# Patient Record
Sex: Male | Born: 1945 | Race: White | Hispanic: No | Marital: Married | Smoking: Former smoker
Health system: Southern US, Community
[De-identification: ages and names within clinical notes are randomized; demographics above are authoritative.]

## PROBLEM LIST (undated history)

## (undated) DIAGNOSIS — M5030 Other cervical disc degeneration, unspecified cervical region: Secondary | ICD-10-CM

## (undated) DIAGNOSIS — G20A1 Parkinson's disease without dyskinesia, without mention of fluctuations: Secondary | ICD-10-CM

## (undated) DIAGNOSIS — E785 Hyperlipidemia, unspecified: Secondary | ICD-10-CM

## (undated) DIAGNOSIS — H9190 Unspecified hearing loss, unspecified ear: Secondary | ICD-10-CM

## (undated) DIAGNOSIS — G20 Parkinson's disease: Secondary | ICD-10-CM

## (undated) HISTORY — PX: HERNIA REPAIR: SHX51

## (undated) HISTORY — DX: Parkinson's disease: G20

## (undated) HISTORY — DX: Parkinson's disease without dyskinesia, without mention of fluctuations: G20.A1

## (undated) HISTORY — DX: Other cervical disc degeneration, unspecified cervical region: M50.30

## (undated) HISTORY — DX: Hyperlipidemia, unspecified: E78.5

## (undated) HISTORY — DX: Unspecified hearing loss, unspecified ear: H91.90

---

## 2006-06-29 ENCOUNTER — Ambulatory Visit: Payer: Self-pay | Admitting: Family Medicine

## 2006-11-07 ENCOUNTER — Ambulatory Visit: Payer: Self-pay | Admitting: Family Medicine

## 2006-11-15 ENCOUNTER — Ambulatory Visit: Payer: Self-pay | Admitting: Family Medicine

## 2006-11-15 LAB — CONVERTED CEMR LAB
ALT: 17 units/L (ref 0–40)
AST: 17 units/L (ref 0–37)
Alkaline Phosphatase: 82 units/L (ref 39–117)
Cholesterol: 198 mg/dL (ref 0–200)
HDL: 49.5 mg/dL (ref >39.0)
LDL Cholesterol: 129 mg/dL — ABNORMAL HIGH (ref 0–99)
Total CHOL/HDL Ratio: 4
Triglycerides: 96 mg/dL (ref 0–149)
VLDL: 19 mg/dL (ref 0–40)

## 2006-11-29 ENCOUNTER — Ambulatory Visit: Payer: Self-pay | Admitting: Family Medicine

## 2006-11-29 LAB — CONVERTED CEMR LAB
Basophils Absolute: 0 10*3/uL (ref 0.0–0.1)
Basophils Relative: 0.4 % (ref 0.0–1.0)
Eosinophils Absolute: 0.1 10*3/uL (ref 0.0–0.6)
Eosinophils Relative: 1.1 % (ref 0.0–5.0)
HCT: 46 % (ref 39.0–52.0)
Hemoglobin: 15.8 g/dL (ref 13.0–17.0)
Lymphocytes Relative: 22.3 % (ref 12.0–46.0)
MCHC: 34.3 g/dL (ref 30.0–36.0)
MCV: 91.1 fL (ref 78.0–100.0)
Monocytes Absolute: 0.4 10*3/uL (ref 0.2–0.7)
Monocytes Relative: 8.3 % (ref 3.0–11.0)
Neutro Abs: 3.1 10*3/uL (ref 1.4–7.7)
Neutrophils Relative %: 67.9 % (ref 43.0–77.0)
PSA: 0.54 ng/mL (ref 0.10–4.00)
Platelets: 168 10*3/uL (ref 150–400)
RBC: 5.05 M/uL (ref 4.22–5.81)
RDW: 12.3 % (ref 11.5–14.6)
TSH: 2.09 microintl units/mL (ref 0.35–5.50)
WBC: 4.6 10*3/uL (ref 4.5–10.5)

## 2007-06-12 DIAGNOSIS — G20 Parkinson's disease: Secondary | ICD-10-CM | POA: Insufficient documentation

## 2007-06-12 DIAGNOSIS — E785 Hyperlipidemia, unspecified: Secondary | ICD-10-CM | POA: Insufficient documentation

## 2007-11-06 ENCOUNTER — Ambulatory Visit: Payer: Self-pay | Admitting: Family Medicine

## 2007-11-06 DIAGNOSIS — M542 Cervicalgia: Secondary | ICD-10-CM | POA: Insufficient documentation

## 2007-11-06 DIAGNOSIS — K4090 Unilateral inguinal hernia, without obstruction or gangrene, not specified as recurrent: Secondary | ICD-10-CM | POA: Insufficient documentation

## 2007-12-01 ENCOUNTER — Ambulatory Visit: Payer: Self-pay | Admitting: Family Medicine

## 2007-12-01 LAB — CONVERTED CEMR LAB
ALT: 18 units/L (ref 0–53)
AST: 16 units/L (ref 0–37)
Albumin: 3.7 g/dL (ref 3.5–5.2)
Alkaline Phosphatase: 65 units/L (ref 39–117)
BUN: 14 mg/dL (ref 6–23)
Basophils Absolute: 0 10*3/uL (ref 0.0–0.1)
Basophils Relative: 0.6 % (ref 0.0–1.0)
Bilirubin Urine: NEGATIVE
Bilirubin, Direct: 0.3 mg/dL (ref 0.0–0.3)
Blood in Urine, dipstick: NEGATIVE
CO2: 30 meq/L (ref 19–32)
Calcium: 8.9 mg/dL (ref 8.4–10.5)
Chloride: 105 meq/L (ref 96–112)
Cholesterol: 205 mg/dL (ref 0–200)
Creatinine, Ser: 1.1 mg/dL (ref 0.4–1.5)
Direct LDL: 138.6 mg/dL
Eosinophils Absolute: 0.1 10*3/uL (ref 0.0–0.6)
Eosinophils Relative: 1.8 % (ref 0.0–5.0)
GFR calc Af Amer: 88 mL/min
GFR calc non Af Amer: 73 mL/min
Glucose, Bld: 93 mg/dL (ref 70–99)
HCT: 46.7 % (ref 39.0–52.0)
HDL: 55.9 mg/dL (ref >39.0)
Hemoglobin: 15.7 g/dL (ref 13.0–17.0)
Ketones, urine, test strip: NEGATIVE
Lymphocytes Relative: 22.8 % (ref 12.0–46.0)
MCHC: 33.5 g/dL (ref 30.0–36.0)
MCV: 90.9 fL (ref 78.0–100.0)
Monocytes Absolute: 0.4 10*3/uL (ref 0.2–0.7)
Monocytes Relative: 8.6 % (ref 3.0–11.0)
Neutro Abs: 2.9 10*3/uL (ref 1.4–7.7)
Neutrophils Relative %: 66.2 % (ref 43.0–77.0)
Nitrite: NEGATIVE
PSA: 0.42 ng/mL (ref 0.10–4.00)
Platelets: 144 10*3/uL — ABNORMAL LOW (ref 150–400)
Potassium: 4.2 meq/L (ref 3.5–5.1)
Protein, U semiquant: NEGATIVE
RBC: 5.14 M/uL (ref 4.22–5.81)
RDW: 12.3 % (ref 11.5–14.6)
Sodium: 140 meq/L (ref 135–145)
Specific Gravity, Urine: 1.025
TSH: 1.88 microintl units/mL (ref 0.35–5.50)
Total Bilirubin: 0.9 mg/dL (ref 0.3–1.2)
Total CHOL/HDL Ratio: 3.7
Total Protein: 5.6 g/dL — ABNORMAL LOW (ref 6.0–8.3)
Triglycerides: 54 mg/dL (ref 0–149)
Urobilinogen, UA: 0.2
VLDL: 11 mg/dL (ref 0–40)
WBC Urine, dipstick: NEGATIVE
WBC: 4.4 10*3/uL — ABNORMAL LOW (ref 4.5–10.5)
pH: 5.5

## 2007-12-11 ENCOUNTER — Ambulatory Visit: Payer: Self-pay | Admitting: Family Medicine

## 2007-12-12 ENCOUNTER — Encounter: Payer: Self-pay | Admitting: Internal Medicine

## 2008-03-04 ENCOUNTER — Ambulatory Visit: Payer: Self-pay | Admitting: Internal Medicine

## 2008-03-19 ENCOUNTER — Ambulatory Visit: Payer: Self-pay | Admitting: Internal Medicine

## 2008-03-19 LAB — HM COLONOSCOPY

## 2009-01-01 ENCOUNTER — Ambulatory Visit: Payer: Self-pay | Admitting: Family Medicine

## 2009-01-01 LAB — CONVERTED CEMR LAB
ALT: 16 units/L (ref 0–53)
AST: 15 units/L (ref 0–37)
Albumin: 4 g/dL (ref 3.5–5.2)
Alkaline Phosphatase: 65 units/L (ref 39–117)
BUN: 19 mg/dL (ref 6–23)
Basophils Absolute: 0 10*3/uL (ref 0.0–0.1)
Basophils Relative: 0.4 % (ref 0.0–3.0)
Bilirubin Urine: NEGATIVE
Bilirubin, Direct: 0.2 mg/dL (ref 0.0–0.3)
Blood in Urine, dipstick: NEGATIVE
CO2: 31 meq/L (ref 19–32)
Calcium: 8.8 mg/dL (ref 8.4–10.5)
Chloride: 108 meq/L (ref 96–112)
Cholesterol: 224 mg/dL — ABNORMAL HIGH (ref 0–200)
Creatinine, Ser: 1 mg/dL (ref 0.4–1.5)
Direct LDL: 145 mg/dL
Eosinophils Absolute: 0.1 10*3/uL (ref 0.0–0.7)
Eosinophils Relative: 1.3 % (ref 0.0–5.0)
GFR calc non Af Amer: 80.63 mL/min (ref >60)
Glucose, Bld: 100 mg/dL — ABNORMAL HIGH (ref 70–99)
Glucose, Urine, Semiquant: NEGATIVE
HCT: 46.4 % (ref 39.0–52.0)
HDL: 62.3 mg/dL (ref >39.00)
Hemoglobin: 15.8 g/dL (ref 13.0–17.0)
Ketones, urine, test strip: NEGATIVE
Lymphocytes Relative: 22.4 % (ref 12.0–46.0)
Lymphs Abs: 1.1 10*3/uL (ref 0.7–4.0)
MCHC: 34 g/dL (ref 30.0–36.0)
MCV: 92.2 fL (ref 78.0–100.0)
Monocytes Absolute: 0.3 10*3/uL (ref 0.1–1.0)
Monocytes Relative: 7 % (ref 3.0–12.0)
Neutro Abs: 3.3 10*3/uL (ref 1.4–7.7)
Neutrophils Relative %: 68.9 % (ref 43.0–77.0)
Nitrite: NEGATIVE
PSA: 0.39 ng/mL (ref 0.10–4.00)
Platelets: 139 10*3/uL — ABNORMAL LOW (ref 150.0–400.0)
Potassium: 3.8 meq/L (ref 3.5–5.1)
RBC: 5.03 M/uL (ref 4.22–5.81)
RDW: 12.4 % (ref 11.5–14.6)
Sodium: 144 meq/L (ref 135–145)
Specific Gravity, Urine: 1.025
TSH: 1.99 microintl units/mL (ref 0.35–5.50)
Total Bilirubin: 0.9 mg/dL (ref 0.3–1.2)
Total CHOL/HDL Ratio: 4
Total Protein: 6.2 g/dL (ref 6.0–8.3)
Triglycerides: 66 mg/dL (ref 0.0–149.0)
Urobilinogen, UA: 0.2
VLDL: 13.2 mg/dL (ref 0.0–40.0)
WBC Urine, dipstick: NEGATIVE
WBC: 4.8 10*3/uL (ref 4.5–10.5)
pH: 5

## 2009-01-10 LAB — CONVERTED CEMR LAB: Vit D, 25-Hydroxy: 32 ng/mL (ref 30–89)

## 2009-01-21 ENCOUNTER — Ambulatory Visit: Payer: Self-pay | Admitting: Family Medicine

## 2009-01-21 DIAGNOSIS — F528 Other sexual dysfunction not due to a substance or known physiological condition: Secondary | ICD-10-CM | POA: Insufficient documentation

## 2009-10-04 HISTORY — PX: TOTAL KNEE ARTHROPLASTY: SHX125

## 2010-03-13 ENCOUNTER — Ambulatory Visit: Payer: Self-pay | Admitting: Family Medicine

## 2010-03-13 LAB — CONVERTED CEMR LAB
ALT: 18 units/L (ref 0–53)
AST: 17 units/L (ref 0–37)
Albumin: 4.2 g/dL (ref 3.5–5.2)
Alkaline Phosphatase: 74 units/L (ref 39–117)
BUN: 18 mg/dL (ref 6–23)
Basophils Absolute: 0 10*3/uL (ref 0.0–0.1)
Basophils Relative: 0.5 % (ref 0.0–3.0)
Bilirubin Urine: NEGATIVE
Bilirubin, Direct: 0.1 mg/dL (ref 0.0–0.3)
Blood in Urine, dipstick: NEGATIVE
CO2: 30 meq/L (ref 19–32)
Calcium: 8.9 mg/dL (ref 8.4–10.5)
Chloride: 106 meq/L (ref 96–112)
Cholesterol: 229 mg/dL — ABNORMAL HIGH (ref 0–200)
Creatinine, Ser: 1.1 mg/dL (ref 0.4–1.5)
Direct LDL: 149 mg/dL
Eosinophils Absolute: 0 10*3/uL (ref 0.0–0.7)
Eosinophils Relative: 0.9 % (ref 0.0–5.0)
GFR calc non Af Amer: 75.1 mL/min (ref >60)
Glucose, Bld: 95 mg/dL (ref 70–99)
Glucose, Urine, Semiquant: NEGATIVE
HCT: 45.6 % (ref 39.0–52.0)
HDL: 67.4 mg/dL (ref >39.00)
Hemoglobin: 15.7 g/dL (ref 13.0–17.0)
Ketones, urine, test strip: NEGATIVE
Lymphocytes Relative: 23.4 % (ref 12.0–46.0)
Lymphs Abs: 1.3 10*3/uL (ref 0.7–4.0)
MCHC: 34.4 g/dL (ref 30.0–36.0)
MCV: 93 fL (ref 78.0–100.0)
Monocytes Absolute: 0.4 10*3/uL (ref 0.1–1.0)
Monocytes Relative: 6.9 % (ref 3.0–12.0)
Neutro Abs: 3.7 10*3/uL (ref 1.4–7.7)
Neutrophils Relative %: 68.3 % (ref 43.0–77.0)
Nitrite: NEGATIVE
PSA: 0.42 ng/mL (ref 0.10–4.00)
Platelets: 170 10*3/uL (ref 150.0–400.0)
Potassium: 4.6 meq/L (ref 3.5–5.1)
Protein, U semiquant: NEGATIVE
RBC: 4.9 M/uL (ref 4.22–5.81)
RDW: 13.4 % (ref 11.5–14.6)
Sodium: 141 meq/L (ref 135–145)
Specific Gravity, Urine: >=1.03
TSH: 1.93 microintl units/mL (ref 0.35–5.50)
Total Bilirubin: 0.9 mg/dL (ref 0.3–1.2)
Total CHOL/HDL Ratio: 3
Total Protein: 6.3 g/dL (ref 6.0–8.3)
Triglycerides: 42 mg/dL (ref 0.0–149.0)
Urobilinogen, UA: 0.2
VLDL: 8.4 mg/dL (ref 0.0–40.0)
WBC Urine, dipstick: NEGATIVE
WBC: 5.4 10*3/uL (ref 4.5–10.5)
pH: 5

## 2010-04-21 ENCOUNTER — Ambulatory Visit: Payer: Self-pay | Admitting: Family Medicine

## 2010-05-05 ENCOUNTER — Telehealth: Payer: Self-pay | Admitting: Family Medicine

## 2010-07-17 ENCOUNTER — Ambulatory Visit: Payer: Self-pay | Admitting: Family Medicine

## 2010-07-17 DIAGNOSIS — M1710 Unilateral primary osteoarthritis, unspecified knee: Secondary | ICD-10-CM

## 2010-07-17 DIAGNOSIS — IMO0002 Reserved for concepts with insufficient information to code with codable children: Secondary | ICD-10-CM | POA: Insufficient documentation

## 2010-08-03 ENCOUNTER — Inpatient Hospital Stay (HOSPITAL_COMMUNITY): Admission: RE | Admit: 2010-08-03 | Discharge: 2010-08-06 | Payer: Self-pay | Admitting: Orthopedic Surgery

## 2010-08-20 ENCOUNTER — Encounter: Admission: RE | Admit: 2010-08-20 | Discharge: 2010-08-20 | Payer: Self-pay | Admitting: Neurology

## 2010-09-14 ENCOUNTER — Encounter
Admission: RE | Admit: 2010-09-14 | Discharge: 2010-10-01 | Payer: Self-pay | Source: Home / Self Care | Attending: Neurology | Admitting: Neurology

## 2010-10-01 ENCOUNTER — Encounter
Admission: RE | Admit: 2010-10-01 | Discharge: 2010-11-03 | Payer: Self-pay | Source: Home / Self Care | Attending: Neurology | Admitting: Neurology

## 2010-10-04 HISTORY — PX: CERVICAL LAMINECTOMY: SHX94

## 2010-10-28 ENCOUNTER — Encounter: Admit: 2010-10-28 | Payer: Self-pay | Admitting: Neurology

## 2010-11-03 ENCOUNTER — Encounter: Admit: 2010-11-03 | Discharge: 2010-11-03 | Payer: Self-pay | Attending: Neurology | Admitting: Neurology

## 2010-11-03 NOTE — Progress Notes (Signed)
Summary: Pt req a script for Celebrex 200mg    Phone Note Call from Patient Call back at Home Phone [phone]   Caller: Patient Summary of Call: Pt req script for Celebrex200mg  1 capsule each morning.  Pt says that his arthirtus doctor, Dr. Madelon Lips, wouldnt fill med because pt hasn't been to see him for a while. Dr. Madelon Lips recommended that he contact his PCP for refill. Pls call in to [facility] [phone] Initial call taken by: Lucy Antigua,  May 05, 2010 10:01 AM  Follow-up for Phone Call        please call patient.......... I feel the other anti-inflammatories at work just as well, and might be safer like Motrin OTC 600 mg b.i.d....... Celebrex is inexpensive drug plus has traits a similar  to Vioxx, which had been pulled because of cardiovascular risk Follow-up by: Roderick Pee MD,  May 05, 2010 4:23 PM  Additional Follow-up for Phone Call Additional follow up Details #1::        left message on machine for patient  Additional Follow-up by: Kern Reap CMA Duncan Dull),  May 07, 2010 9:50 AM

## 2010-11-03 NOTE — Assessment & Plan Note (Signed)
Summary: cpx/mhf   Vital Signs:  Patient profile:   65 year old male Height:      70 inches Weight:      199 pounds BMI:     28.66 Temp:     98.4 degrees F oral BP sitting:   134 / 94  (left arm) Cuff size:   regular  Vitals Entered By: Kern Reap CMA (January 21, 2009 4:08 PM)  Reason for Visit cpx  History of Present Illness: Francisco Gutierrez is a 65 year old, married male, who comes in today for evaluation of Parkinson's disease and a general medical exam.  He has underlying Parkinson's disease, for which he takes amantadine 100 mg b.i.d. azilect  1 mg daily,Requip 8 mg daily, and coenzyme 10 3 caps daily.  He states he feels well and has no major complaints.  He just finished a cruise in the [place] and feels well.  He had a colonoscopy, which showed some minor diverticuli in 2009 advised him back every 10 years.  He gets routine eye care and dental care.  Last tetanus is 2007 will give him a pneumonia vaccine today.  It also like to retry the Viagra.  He states it gave him a bad headache.  Discussed splitting the dose and pre-medicating with 600 mg of Motrin.  Allergies (verified): No Known Drug Allergies  Past History:  Past medical history reviewed for relevance to current acute and chronic problems. Past surgical history reviewed for relevance to current acute and chronic problems. Family history reviewed for relevance to current acute and chronic problems. Social history (including risk factors) reviewed for relevance to current acute and chronic problems.  Past Medical History:    Reviewed history from 12/11/2007 and no changes required:    Hyperlipidemia    Parkinsons disease    bilateral hernia repair  Family History:    Reviewed history from 12/11/2007 and no changes required:              father is in his 90s had a strokemother 59 demented       one brother in good health       father also had colon cancer  Social History:    Reviewed history from 11/06/2007 and  no changes required:       Retired Runner, broadcasting/film/video       Married       Never Smoked       Alcohol use-no       Drug use-no       Regular exercise-yes  Review of Systems      See HPI  Physical Exam  General:  Well-developed,well-nourished,in no acute distress; alert,appropriate and cooperative throughout examination Head:  Normocephalic and atraumatic without obvious abnormalities. No apparent alopecia or balding. Eyes:  No corneal or conjunctival inflammation noted. EOMI. Perrla. Funduscopic exam benign, without hemorrhages, exudates or papilledema. Vision grossly normal. Ears:  External ear exam shows no significant lesions or deformities.  Otoscopic examination reveals clear canals, tympanic membranes are intact bilaterally without bulging, retraction, inflammation or discharge. Hearing is grossly normal bilaterally. Nose:  External nasal examination shows no deformity or inflammation. Nasal mucosa are pink and moist without lesions or exudates. Mouth:  Oral mucosa and oropharynx without lesions or exudates.  Teeth in good repair. Neck:  No deformities, masses, or tenderness noted. Chest Wall:  No deformities, masses, tenderness or gynecomastia noted. Breasts:  No masses or gynecomastia noted Lungs:  Normal respiratory effort, chest expands symmetrically. Lungs are clear to auscultation, no  crackles or wheezes. Heart:  Normal rate and regular rhythm. S1 and S2 normal without gallop, murmur, click, rub or other extra sounds. Abdomen:  Bowel sounds positive,abdomen soft and non-tender without masses, organomegaly or hernias noted. Rectal:  No external abnormalities noted. Normal sphincter tone. No rectal masses or tenderness. Genitalia:  Testes bilaterally descended without nodularity, tenderness or masses. No scrotal masses or lesions. No penis lesions or urethral discharge. Prostate:  Prostate gland firm and smooth, no enlargement, nodularity, tenderness, mass, asymmetry or induration. Msk:  No  deformity or scoliosis noted of thoracic or lumbar spine.   Pulses:  R and L carotid,radial,femoral,dorsalis pedis and posterior tibial pulses are full and equal bilaterally Extremities:  No clubbing, cyanosis, edema, or deformity noted with normal full range of motion of all joints.   Neurologic:  No cranial nerve deficits noted. Station and gait are normal. Plantar reflexes are down-going bilaterally. DTRs are symmetrical throughout. Sensory, motor and coordinative functions appear intact. Skin:  Intact without suspicious lesions or rashes Cervical Nodes:  No lymphadenopathy noted Axillary Nodes:  No palpable lymphadenopathy Inguinal Nodes:  No significant adenopathy Psych:  Cognition and judgment appear intact. Alert and cooperative with normal attention span and concentration. No apparent delusions, illusions, hallucinations   Impression & Recommendations:  Problem # 1:  PHYSICAL EXAMINATION (ICD-V70.0) Assessment Unchanged  Orders: Prescription Created Electronically [id number]) EKG w/ Interpretation (93000)  Problem # 2:  PARKINSON'S DISEASE (ICD-332.0) Assessment: Unchanged  Orders: Prescription Created Electronically [phone])  Problem # 3:  ERECTILE DYSFUNCTION (ICD-302.72) Assessment: Deteriorated  Orders: Prescription Created Electronically [phone])  His updated medication list for this problem includes:    Viagra 50 Mg Tabs (Sildenafil citrate) ..... Uad  Complete Medication List: 1)  Amantadine Hcl 100 Mg Tabs (Amantadine hcl) .... Take 1 tablet by mouth twice a day 2)  Requip Xl 8 Mg Tb24 (Ropinirole hcl) .... Take 1 tablet by mouth once a day 3)  Aspirin 81 Mg Tbec (Aspirin) .... Once daily 4)  Azilect 1 Mg Tabs (Rasagiline mesylate) .... Take 1 tablet by mouth once a day 5)  Co Q-10 Maximum Strength 400 Mg Caps (Coenzyme q10) .... 3 caps 6)  Viagra 50 Mg Tabs (Sildenafil citrate) .... Uad  Other Orders: Pneumococcal Vaccine (95621) Admin 1st Vaccine  [phone])  Patient Instructions: 1)  continue your current medications. 2)  Take 600 mg of Motrin one hour prior to taking the Viagra. 3)  Please schedule a follow-up appointment in 1 year. 4)  Take an Aspirin every day. Prescriptions: REQUIP XL 8 MG  TB24 (ROPINIROLE HCL) Take 1 tablet by mouth once a day  #100 x 4   Entered and Authorized by:   Roderick Pee MD   Signed by:   Roderick Pee MD on 01/21/2009   Method used:   [facility]ronically to        [facility]       [address]       [place], [place]  [address]       Ph: [phone] or [phone]       Fax: [phone]   RxID:   [id number] AZILECT 1 MG  TABS (RASAGILINE MESYLATE) Take 1 tablet by mouth once a day  #100 x 4   Entered and Authorized by:   Roderick Pee MD   Signed by:   Roderick Pee MD on 01/21/2009   Method used:   Electronically to        [facility]       [address]       [place], [place]  [address]       Ph: [phone] or [phone]       Fax: [phone]   RxID:   [id number] REQUIP XL 8 MG  TB24 (ROPINIROLE HCL) Take 1 tablet by mouth once a day  #100 x 4   Entered and Authorized by:   Roderick Pee MD   Signed by:   Roderick Pee MD on 01/21/2009   Method used:   Electronically to        [facility]       [address]       [place], [place]  [address]       Ph: [phone] or [phone]       Fax: [phone]   RxID:   [id number] AMANTADINE HCL 100 MG TABS (AMANTADINE HCL) Take 1 tablet by mouth twice a day  #200 x 4   Entered and Authorized by:   Roderick Pee MD   Signed by:   Roderick Pee MD on 01/21/2009   Method used:   Electronically to        [facility]       [address]       [place], [place]  [address]       Ph: [phone] or [phone]       Fax: [phone]   RxID:   [id number] VIAGRA 50 MG TABS (SILDENAFIL CITRATE) UAD  #6 x 11   Entered and Authorized by:   Roderick Pee MD   Signed  by:   Roderick Pee MD on 01/21/2009   Method used:   Electronically to        [facility]       [address]       [place], [place]  [address]       Ph: [phone] or [phone]       Fax: [phone]   RxID:   [id number]        Immunizations Administered:  Pneumonia Vaccine:    Vaccine Type: Pneumovax    Site: left deltoid    Mfr: Merck    Dose: 0.5 ml    Route: IM    Given by: Kern Reap CMA    Exp. Date: 11/08/2009    Lot #: [id number]    Physician counseled: yes

## 2010-11-03 NOTE — Assessment & Plan Note (Signed)
Summary: SURGICAL CLEARANCE FOR KNEE REPLACEMENT//SLM   Vital Signs:  Patient profile:   65 year old male Weight:      150 pounds Temp:     98.1 degrees F oral Pulse rhythm:   regular BP sitting:   140 / 92  Vitals Entered By: Lynann Beaver CMA (July 17, 2010 10:13 AM) CC: surgical clearance Is Patient Diabetic? No Pain Assessment Patient in pain? no        CC:  surgical clearance.  History of Present Illness: bodi is a 65 year old male, nonsmoker, who comes in today for pre-op clearance for total left knee replacement.  When he was a child.  He jumped out of a bar and an injured his left knee.  Now he has bone rubbing on bone  and is able to walk.  His right knee is fairly normal.  These always been in excellent health.  He said no chronic health problems except for some mild Parkinson's disease.  He is currently stable on this medication.  I think he is an excellent candidate.  He has no history of any known underlying medical problems that  would  preclude him having as an operation    Allergies: No Known Drug Allergies  Past History:  Past medical, surgical, family and social histories (including risk factors) reviewed, and no changes noted (except as noted below).  Past Medical History: Reviewed history from 12/11/2007 and no changes required. Hyperlipidemia Parkinsons disease bilateral hernia repair  Family History: Reviewed history from 12/11/2007 and no changes required.  father is in his 90s had a strokemother 31 demented one brother in good health father also had colon cancer  Social History: Reviewed history from 11/06/2007 and no changes required. 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 Lungs:  Normal respiratory effort, chest expands symmetrically. Lungs are clear to auscultation, no crackles or wheezes. Heart:  Normal rate and regular rhythm. S1 and S2 normal without gallop, murmur, click, rub or other extra sounds. Abdomen:  Bowel sounds positive,abdomen soft and non-tender without masses, organomegaly or hernias noted.   Impression & Recommendations:  Problem # 1:  DEGENERATIVE JOINT DISEASE, LEFT KNEE (ICD-715.96) Assessment Deteriorated  His updated medication list for this problem includes:    Aspirin 81 Mg Tbec (Aspirin) ..... Once daily  Complete Medication List: 1)  Amantadine Hcl 100 Mg Tabs (Amantadine hcl) .... Take 1 tablet by mouth twice a day 2)  Requip Xl 8 Mg Tb24 (Ropinirole hcl) .... Take 1 tablet by mouth once a day 3)  Aspirin 81 Mg Tbec (Aspirin) .... Once daily 4)  Azilect 1 Mg Tabs (Rasagiline mesylate) .... Take 1 tablet by mouth once a day 5)  Co Q-10 Maximum Strength 400 Mg Caps (Coenzyme q10) .... 3 caps 6)  Viagra 50 Mg Tabs (Sildenafil citrate) .... Uad 7)  Centrum Tabs (Multiple vitamins-minerals) .... Once daily 8)  Fish Oil Oil (Fish oil) .... Once daily 9)  Glucosamine 500 Mg Caps (Glucosamine sulfate) .... Once daily  Patient Instructions: 1)  continue current medications.  Remember to stop the aspirin a week prior to surgery

## 2010-11-03 NOTE — Procedures (Signed)
Summary: Direct referral form  Direct referral form   Imported By: Lester [place] 03/11/2008 11:21:59  _____________________________________________________________________  External Attachment:    Type:   Image     Comment:   External Document

## 2010-11-03 NOTE — Assessment & Plan Note (Signed)
Summary: cpx/ccm   Vital Signs:  Patient Profile:   65 Years Old Male Weight:      195 pounds Temp:     98.1 degrees F oral BP sitting:   128 / 74  (left arm)  Vitals Entered By: Rock Nephew CMA (December 11, 2007 4:15 PM)                 Chief Complaint:  CPX.  History of Present Illness: Francisco Gutierrez is a 65 year old male, originally from [place], [place] who comes in today for evaluation of Parkinson's disease, restless leg syndrome, hyperlipidemia.  He states his Parkinson's disease is fairly stable.  He takes frequent 8 mg a day, along with azilect 1 mg at bedtime failed to takes amantadine 100 mg b.i.d.  His hyperlipidemia.  History Y. Purnell Shoemaker.  Next size.  He also takes an aspirin daily.    Current Allergies: No known allergies   Past Medical History:    Reviewed history from 06/12/2007 and no changes required:       Hyperlipidemia       Parkinsons disease       bilateral hernia repair   Family History:    Reviewed history from 11/06/2007 and no changes required:              father is in his 90s had a strokemother 72 demented       one brother in good health       father also had colon cancer  Social History:    Reviewed history from 11/06/2007 and no changes required:       Retired Runner, broadcasting/film/video       Married       Never Smoked       Alcohol use-no       Drug use-no       Regular exercise-yes    Review of Systems      See HPI   Physical Exam  General:     Well-developed,well-nourished,in no acute distress; alert,appropriate and cooperative throughout examination Head:     Normocephalic and atraumatic without obvious abnormalities. No apparent alopecia or balding. Eyes:     No corneal or conjunctival inflammation noted. EOMI. Perrla. Funduscopic exam benign, without hemorrhages, exudates or papilledema. Vision grossly normal. Ears:     External ear exam shows no significant lesions or deformities.  Otoscopic examination reveals clear canals, tympanic  membranes are intact bilaterally without bulging, retraction, inflammation or discharge. Hearing is grossly normal bilaterally. Nose:     External nasal examination shows no deformity or inflammation. Nasal mucosa are pink and moist without lesions or exudates. Mouth:     Oral mucosa and oropharynx without lesions or exudates.  Teeth in good repair. Neck:     No deformities, masses, or tenderness noted. Chest Wall:     No deformities, masses, tenderness or gynecomastia noted. Breasts:     No masses or gynecomastia noted Lungs:     Normal respiratory effort, chest expands symmetrically. Lungs are clear to auscultation, no crackles or wheezes. Heart:     Normal rate and regular rhythm. S1 and S2 normal without gallop, murmur, click, rub or other extra sounds. Abdomen:     Bowel sounds positive,abdomen soft and non-tender without masses, organomegaly or hernias noted. Rectal:     No external abnormalities noted. Normal sphincter tone. No rectal masses or tenderness. Genitalia:     Testes bilaterally descended without nodularity, tenderness or masses. No scrotal masses or lesions. No penis  lesions or urethral discharge. Prostate:     Prostate gland firm and smooth, no enlargement, nodularity, tenderness, mass, asymmetry or induration. Msk:     No deformity or scoliosis noted of thoracic or lumbar spine.   Pulses:     R and L carotid,radial,femoral,dorsalis pedis and posterior tibial pulses are full and equal bilaterally Extremities:     No clubbing, cyanosis, edema, or deformity noted with normal full range of motion of all joints.   Neurologic:     mild resting bilateral tremor, otherwise, within normal limits    Impression & Recommendations:  Problem # 1:  PARKINSON'S DISEASE (ICD-332.0) Assessment: Unchanged  Problem # 2:  HYPERLIPIDEMIA (ICD-272.4) Assessment: Improved  Complete Medication List: 1)  Amantadine Hcl 100 Mg Tabs (Amantadine hcl) .... Take 1 tablet by mouth  twice a day 2)  Requip Xl 8 Mg Tb24 (Ropinirole hcl) .... Take 1 tablet by mouth once a day 3)  Aspirin 81 Mg Tbec (Aspirin) .... Once daily 4)  Azilect 1 Mg Tabs (Rasagiline mesylate) .... Take 1 tablet by mouth once a day 5)  Co Q-10 Maximum Strength 400 Mg Caps (Coenzyme q10) .... 3 caps 6)  Requip Xl 8 Mg Tb24 (Ropinirole hcl)  Other Orders: Gastroenterology Referral (GI)   Patient Instructions: 1)  Please schedule a follow-up appointment in 1 year. 2)  It is important that you exercise regularly at least 20 minutes 5 times a week. If you develop chest pain, have severe difficulty breathing, or feel very tired , stop exercising immediately and seek medical attention. 3)  Schedule a colonoscopy/sigmoidoscopy to help detect colon cancer. 4)  Take an Aspirin every day.    Prescriptions: AZILECT 1 MG  TABS (RASAGILINE MESYLATE) Take 1 tablet by mouth once a day  #100 x 4   Entered and Authorized by:   Roderick Pee MD   Signed by:   Roderick Pee MD on 12/11/2007   Method used:   [facility] to ...       [facility]       [address]       [place], [place]  [address]       Ph: [phone] or [phone]       Fax: [phone]   RxID:   [id number] REQUIP XL 8 MG  TB24 (ROPINIROLE HCL) Take 1 tablet by mouth once a day  #100 x 4   Entered and Authorized by:   Roderick Pee MD   Signed by:   Roderick Pee MD on 12/11/2007   Method used:   [facility] sent to ...       [facility]       [address]       [place], [place]  [address]       Ph: [phone] or [phone]       Fax: [phone]   RxID:   [id number] AMANTADINE HCL 100 MG TABS (AMANTADINE HCL) Take 1 tablet by mouth twice a day  #200 x 4   Entered and Authorized by:   Roderick Pee MD   Signed by:   Roderick Pee MD on 12/11/2007   Method used:   [facility] to ...       [facility]       [address]  [place], [place]  [address]       Ph: [phone] or [phone]       Fax: [phone]   RxID:   [id number]  ]

## 2010-11-03 NOTE — Miscellaneous (Signed)
Summary: GI Previsit  Clinical Lists Changes  Medications: Added new medication of DULCOLAX 5 MG  TBEC (BISACODYL) Day before procedure take 2 at 3pm and 2 at 8pm. - Signed Added new medication of METOCLOPRAMIDE HCL 10 MG  TABS (METOCLOPRAMIDE HCL) As per prep instructions. - Signed Added new medication of MIRALAX   POWD (POLYETHYLENE GLYCOL 3350) As per prep  instructions. - Signed Rx of DULCOLAX 5 MG  TBEC (BISACODYL) Day before procedure take 2 at 3pm and 2 at 8pm.;  #4 x 0;  Signed;  Entered by: Barton Fanny RN;  Authorized by: Hilarie Fredrickson MD;  Method used: Electronic Rx of METOCLOPRAMIDE HCL 10 MG  TABS (METOCLOPRAMIDE HCL) As per prep instructions.;  #2 x 0;  Signed;  Entered by: Barton Fanny RN;  Authorized by: Hilarie Fredrickson MD;  Method used: Electronic Rx of MIRALAX   POWD (POLYETHYLENE GLYCOL 3350) As per prep  instructions.;  #255gm x 0;  Signed;  Entered by: Barton Fanny RN;  Authorized by: Hilarie Fredrickson MD;  Method used: Electronic Observations: Added new observation of NKA: T (03/04/2008 16:21)    Prescriptions: MIRALAX   POWD (POLYETHYLENE GLYCOL 3350) As per prep  instructions.  #255gm x 0   Entered by:   Barton Fanny RN   Authorized by:   Hilarie Fredrickson MD   Signed by:   Barton Fanny RN on 03/04/2008   Method used:   [facility] to ...       [facility]       [address]       [place], [place]  [address]       Ph: [phone] or [phone]       Fax: [phone]   RxID:   [id number] METOCLOPRAMIDE HCL 10 MG  TABS (METOCLOPRAMIDE HCL) As per prep instructions.  #2 x 0   Entered by:   Barton Fanny RN   Authorized by:   Hilarie Fredrickson MD   Signed by:   Barton Fanny RN on 03/04/2008   Method used:   [facility] to ...       [facility]       [address]       [place], [place]  [address]       Ph: [phone] or [phone]       Fax: [phone]   RxID:    [id number] DULCOLAX 5 MG  TBEC (BISACODYL) Day before procedure take 2 at 3pm and 2 at 8pm.  #4 x 0   Entered by:   Barton Fanny RN   Authorized by:   Hilarie Fredrickson MD   Signed by:   Barton Fanny RN on 03/04/2008   Method used:   [facility] to ...       [facility]       [address]       [place], [place]  [address]       Ph: [phone] or [phone]       Fax: [phone]   RxID:   [id number]

## 2010-11-03 NOTE — Procedures (Signed)
Summary: Colonoscopy   Colonoscopy  Procedure date:  03/19/2008  Findings:      Location:  [facility].    Procedures Next Due Date:    Colonoscopy: 04/2018  Patient Name: Francisco Gutierrez, Francisco Gutierrez MRN:  Procedure Procedures: Colonoscopy CPT: 64332.  Personnel: Endoscopist: Wilhemina Bonito. Marina Goodell, MD.  Exam Location: Exam performed in Outpatient Clinic. Outpatient  Patient Consent: Procedure, Alternatives, Risks and Benefits discussed, consent obtained, from patient. Consent was obtained by the RN.  Indications  Increased Risk Screening: For family history of colorectal neoplasia, in  Uncle  History  Current Medications: Patient is not currently taking Coumadin.  Pre-Exam Physical: Performed Mar 19, 2008. Cardio-pulmonary exam, Rectal exam, HEENT exam , Abdominal exam, Mental status exam WNL.  Comments: Pt. history reviewed/updated, physical exam performed prior to initiation of sedation?YES Exam Exam: Extent of exam reached: Cecum, extent intended: Cecum.  The cecum was identified by appendiceal orifice and IC valve. Patient position: on left side. Time to Cecum: 00:04:04. Time for Withdrawl: 00:11:28. Colon retroflexion performed. Images taken. ASA Classification: III. Tolerance: excellent.  Monitoring: Pulse and BP monitoring, Oximetry used. Supplemental O2 given.  Colon Prep Used Miralax for colon prep. Prep results: good.  Sedation Meds: Patient assessed and found to be appropriate for moderate (conscious) sedation. Fentanyl 75 mcg. given IV. Versed 8 mg. given IV.  Findings - DIVERTICULOSIS: Cecum to Sigmoid Colon. ICD9: Diverticulosis, Colon: 562.10.  NORMAL EXAM: Cecum.  NORMAL EXAM: Rectum.   Assessment  Diagnoses: 562.10: Diverticulosis, Colon.  455.0: Hemorrhoids, Internal.   Comments: NO POLYPS SEEN Events  Unplanned Interventions: No intervention was required.  Unplanned Events: There were no complications. Plans Disposition: After  procedure patient sent to recovery. After recovery patient sent home.  Scheduling/Referral: Colonoscopy, to Wilhemina Bonito. Marina Goodell, MD, IN 10 YEARS FOR ROUTINE SCREENING (sooner if clinically indicated),    cc:  Kelle Darting MD  This report was created from the original endoscopy report, which was reviewed and signed by the above listed endoscopist.

## 2010-11-03 NOTE — Assessment & Plan Note (Signed)
Summary: CPX // RS----PT [facility] // RS----PT [facility] (2ND TIME) // RS   Vital Signs:  Patient profile:   65 year old male Height:      86.5 inches Weight:      196 pounds BMI:     18.48 Temp:     98.1 degrees F oral BP sitting:   120 / 84  (left arm) Cuff size:   regular  Vitals Entered By: Kern Reap CMA Duncan Dull) (April 21, 2010 10:36 AM) CC: cpx   CC:  cpx.  History of Present Illness: Francisco Gutierrez is a 65 year old male, who comes in today for physical examination because of underlying Parkinson's disease.  Erectile dysfunction, left knee pain.  His Parkinson's disease is treated with amantadine 100 mg b.i.d., azilect 1 mg daily, Requip 8 mg nightly.  For erectile dysfunction.  He uses 5 or 50 mg p.r.n.  He set a right inguinal hernia repair.  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  He is not to a point where he would request a total knee replacement yet.  He has seen an orthopedist.  He gets routine eye care, dental care, colonoscopy, normal.  GI, tetanus, 2007, Pneumovax 2010, declines, seasonal flu vaccine.  Allergies (verified): No Known Drug Allergies  Past History:  Past medical, surgical, family and social histories (including risk factors) reviewed, and no changes noted (except as noted below).  Past Medical History: Reviewed history from 12/11/2007 and no changes required. Hyperlipidemia Parkinsons disease bilateral hernia repair  Family History: Reviewed history from 12/11/2007 and no changes required.  father is in his 90s had a strokemother 72 demented one brother in good health father also had colon cancer  Social History: Reviewed history from 11/06/2007 and no changes required. Retired Runner, broadcasting/film/video Married Never Smoked Alcohol use-no Drug use-no Regular exercise-yes  Review of Systems      See HPI  Physical Exam  General:  Well-developed,well-nourished,in no acute distress; alert,appropriate and cooperative  throughout examination Head:  Normocephalic and atraumatic without obvious abnormalities. No apparent alopecia or balding. Eyes:  No corneal or conjunctival inflammation noted. EOMI. Perrla. Funduscopic exam benign, without hemorrhages, exudates or papilledema. Vision grossly normal. Ears:  External ear exam shows no significant lesions or deformities.  Otoscopic examination reveals clear canals, tympanic membranes are intact bilaterally without bulging, retraction, inflammation or discharge. Hearing is grossly normal bilaterally. Nose:  External nasal examination shows no deformity or inflammation. Nasal mucosa are pink and moist without lesions or exudates. Mouth:  Oral mucosa and oropharynx without lesions or exudates.  Teeth in good repair. Neck:  No deformities, masses, or tenderness noted. Chest Wall:  No deformities, masses, tenderness or gynecomastia noted. Breasts:  No masses or gynecomastia noted Lungs:  Normal respiratory effort, chest expands symmetrically. Lungs are clear to auscultation, no crackles or wheezes. Heart:  Normal rate and regular rhythm. S1 and S2 normal without gallop, murmur, click, rub or other extra sounds. Abdomen:  Bowel sounds positive,abdomen soft and non-tender without masses, organomegaly or hernias noted. Rectal:  No external abnormalities noted. Normal sphincter tone. No rectal masses or tenderness. Genitalia:  Testes bilaterally descended without nodularity, tenderness or masses. No scrotal masses or lesions. No penis lesions or urethral discharge. Prostate:  Prostate gland firm and smooth, no enlargement, nodularity, tenderness, mass, asymmetry or induration. Msk:  No deformity or scoliosis noted of thoracic or lumbar spine.   Pulses:  R and L carotid,radial,femoral,dorsalis pedis and posterior tibial pulses are full and equal bilaterally  Extremities:  No clubbing, cyanosis, edema, or deformity noted with normal full range of motion of all joints.     Neurologic:  alert & oriented X3, cranial nerves II-XII intact, and ataxic gait.   Skin:  Intact without suspicious lesions or rashes Cervical Nodes:  No lymphadenopathy noted Axillary Nodes:  No palpable lymphadenopathy Inguinal Nodes:  No significant adenopathy Psych:  Cognition and judgment appear intact. Alert and cooperative with normal attention span and concentration. No apparent delusions, illusions, hallucinations   Impression & Recommendations:  Problem # 1:  ERECTILE DYSFUNCTION (ICD-302.72) Assessment Improved  His updated medication list for this problem includes:    Viagra 50 Mg Tabs (Sildenafil citrate) ..... Uad  Orders: Prescription Created Electronically [id number]) EKG w/ Interpretation (93000)  Problem # 2:  PHYSICAL EXAMINATION (ICD-V70.0) Assessment: Unchanged  Orders: Prescription Created Electronically [id number]) EKG w/ Interpretation (93000)  Problem # 3:  PARKINSON'S DISEASE (ICD-332.0) Assessment: Unchanged  Orders: Prescription Created Electronically [id number])  Complete Medication List: 1)  Amantadine Hcl 100 Mg Tabs (Amantadine hcl) .... Take 1 tablet by mouth twice a day 2)  Requip Xl 8 Mg Tb24 (Ropinirole hcl) .... Take 1 tablet by mouth once a day 3)  Aspirin 81 Mg Tbec (Aspirin) .... Once daily 4)  Azilect 1 Mg Tabs (Rasagiline mesylate) .... Take 1 tablet by mouth once a day 5)  Co Q-10 Maximum Strength 400 Mg Caps (Coenzyme q10) .... 3 caps 6)  Viagra 50 Mg Tabs (Sildenafil citrate) .... Uad 7)  Centrum Tabs (Multiple vitamins-minerals) .... Once daily 8)  Fish Oil Oil (Fish oil) .... Once daily 9)  Glucosamine 500 Mg Caps (Glucosamine sulfate) .... Once daily  Patient Instructions: 1)  continue current medications.  I would recommend a flu shot in the fall 2)  Please schedule a follow-up appointment in 1 year. Prescriptions: VIAGRA 50 MG TABS (SILDENAFIL CITRATE) UAD  #6 x 11   Entered and Authorized by:   Roderick Pee MD   Signed by:    Roderick Pee MD on 04/21/2010   Method used:   Electronically to        [id number] (retail)       [address]       [place], [place]  [address]       Ph: [phone] or [phone]       Fax: [phone]   RxID:   [id number] AZILECT 1 MG  TABS (RASAGILINE MESYLATE) Take 1 tablet by mouth once a day  #100 x 4   Entered and Authorized by:   Roderick Pee MD   Signed by:   Roderick Pee MD on 04/21/2010   Method used:   Electronically to        [id number] (retail)       [address]       [place], [place]  [address]       Ph: [phone] or [phone]       Fax: [phone]   RxID:   [id number] REQUIP XL 8 MG  TB24 (ROPINIROLE HCL) Take 1 tablet by mouth once a day  #100 x 4   Entered and Authorized by:   Roderick Pee MD   Signed by:   Roderick Pee MD on 04/21/2010   Method used:   Electronically to        [id number] (retail)       [address]       [place],  [place]  [address]       Ph: [phone] or [phone]       Fax: [phone]   RxID:   [id number] AMANTADINE HCL 100 MG TABS (AMANTADINE HCL) Take 1 tablet by mouth twice a day  #200 x 4   Entered and Authorized by:   Roderick Pee MD   Signed by:   Roderick Pee MD on 04/21/2010   Method used:   Electronically to        [id number] (retail)       [address]       [place], [place]  [address]       Ph: [phone] or [phone]       Fax: [phone]   RxID:   [id number]

## 2010-11-03 NOTE — Assessment & Plan Note (Signed)
Summary: Throat clicks when swallows/dm   Vital Signs:  Patient Profile:   65 Years Old Male Weight:      196 pounds (89.09 kg) Temp:     97 degrees F (36.11 degrees C) oral Pulse rate:   71 / minute BP sitting:   146 / 81  (left arm)  Pt. in pain?   no  Vitals Entered By: Arcola Jansky, RN (November 06, 2007 4:26 PM)                  Chief Complaint:  "SNAPPING NOISE IN MY THROAT WHEN I SWALLOW" ONSET CHRISTMAS.  History of Present Illness: Francisco Gutierrez is a 65 year old male with underlying Parkinson's disease, who comes in today for evaluation of a clicking sound in his throat.  In a cyst about 6 weeks ago.  No history of trauma.  He points to the hyoid bone as a source of his clicking noise.  He saw Dr. Rosanne Ashing love his neurologist for treatment of Parkinson's disease.  Dr. love.  Does not, feel it's a reaction to any of his medications.  Current Allergies (reviewed today): No known allergies    Family History:    Reviewed history and no changes required:              father is in his 37s had a strokemother 71 demented       one brother in good health  Social History:    Reviewed history and no changes required:       Retired Runner, broadcasting/film/video       Married       Never Smoked       Alcohol use-no       Drug use-no       Regular exercise-yes   Risk Factors:  Tobacco use:  never Drug use:  no Alcohol use:  no Exercise:  yes   Review of Systems      See HPI   Physical Exam  General:     Well-developed,well-nourished,in no acute distress; alert,appropriate and cooperative throughout examination Head:     Normocephalic and atraumatic without obvious abnormalities. No apparent alopecia or balding. Eyes:     No corneal or conjunctival inflammation noted. EOMI. Perrla. Funduscopic exam benign, without hemorrhages, exudates or papilledema. Vision grossly normal. Ears:     External ear exam shows no significant lesions or deformities.  Otoscopic examination reveals  clear canals, tympanic membranes are intact bilaterally without bulging, retraction, inflammation or discharge. Hearing is grossly normal bilaterally. Nose:     External nasal examination shows no deformity or inflammation. Nasal mucosa are pink and moist without lesions or exudates. Mouth:     Oral mucosa and oropharynx without lesions or exudates.  Teeth in good repair. Neck:     when he swallows.  There is a clicking noise in the hyoid bone.  No masses are palpable    Impression & Recommendations:  Problem # 1:  NECK PAIN, RIGHT (ICD-723.1) Assessment: New  His updated medication list for this problem includes:    Aspirin 81 Mg Tbec (Aspirin) ..... Once daily   Complete Medication List: 1)  Amantadine Hcl 100 Mg Tabs (Amantadine hcl) .... Take 1 tablet by mouth twice a day 2)  Requip Xl 8 Mg Tb24 (Ropinirole hcl) .... Take 1 tablet by mouth once a day 3)  Aspirin 81 Mg Tbec (Aspirin) .... Once daily 4)  Azilect 1 Mg Tabs (Rasagiline mesylate) .... Take 1 tablet by mouth once  a day 5)  Co Q-10 Maximum Strength 400 Mg Caps (Coenzyme q10) .... 3 caps 6)  Requip Xl 8 Mg Tb24 (Ropinirole hcl)   Patient Instructions: 1)  call Dr. Narda Bonds for consultation.  I do not think the clicking noise is related to anything bad however, because of your concern  lets get a consult to be sure    ]

## 2010-11-06 ENCOUNTER — Ambulatory Visit: Payer: BC Managed Care – PPO | Attending: Neurology | Admitting: Occupational Therapy

## 2010-11-06 DIAGNOSIS — R279 Unspecified lack of coordination: Secondary | ICD-10-CM | POA: Insufficient documentation

## 2010-11-06 DIAGNOSIS — IMO0001 Reserved for inherently not codable concepts without codable children: Secondary | ICD-10-CM | POA: Insufficient documentation

## 2010-11-06 DIAGNOSIS — M6281 Muscle weakness (generalized): Secondary | ICD-10-CM | POA: Insufficient documentation

## 2010-11-06 DIAGNOSIS — G20A1 Parkinson's disease without dyskinesia, without mention of fluctuations: Secondary | ICD-10-CM | POA: Insufficient documentation

## 2010-11-06 DIAGNOSIS — G20 Parkinson's disease: Secondary | ICD-10-CM | POA: Insufficient documentation

## 2010-11-09 ENCOUNTER — Encounter: Payer: Self-pay | Admitting: Occupational Therapy

## 2010-11-11 ENCOUNTER — Encounter: Payer: Self-pay | Admitting: Occupational Therapy

## 2010-11-13 ENCOUNTER — Other Ambulatory Visit: Payer: Self-pay | Admitting: Neurology

## 2010-11-13 DIAGNOSIS — M5412 Radiculopathy, cervical region: Secondary | ICD-10-CM

## 2010-11-13 DIAGNOSIS — E785 Hyperlipidemia, unspecified: Secondary | ICD-10-CM

## 2010-11-13 DIAGNOSIS — R531 Weakness: Secondary | ICD-10-CM

## 2010-11-17 ENCOUNTER — Ambulatory Visit
Admission: RE | Admit: 2010-11-17 | Discharge: 2010-11-17 | Disposition: A | Discharge status: Home / Self Care | Payer: BC Managed Care – PPO | Source: Ambulatory Visit | Attending: Neurology | Admitting: Neurology

## 2010-11-17 DIAGNOSIS — M5412 Radiculopathy, cervical region: Secondary | ICD-10-CM

## 2010-11-17 DIAGNOSIS — E785 Hyperlipidemia, unspecified: Secondary | ICD-10-CM

## 2010-11-17 DIAGNOSIS — R531 Weakness: Secondary | ICD-10-CM

## 2010-12-14 ENCOUNTER — Telehealth: Payer: Self-pay | Admitting: Family Medicine

## 2010-12-14 NOTE — Telephone Encounter (Signed)
Father in law has shingles. He is going to visit him soon. Patient will be having surgery in 1 month. Should he visit? Please return call.

## 2010-12-14 NOTE — Telephone Encounter (Signed)
Shingles are only contagious by direct contact

## 2010-12-15 LAB — BASIC METABOLIC PANEL
BUN: 12 mg/dL (ref 6–23)
BUN: 6 mg/dL (ref 6–23)
BUN: 8 mg/dL (ref 6–23)
CO2: 27 mEq/L (ref 19–32)
CO2: 28 mEq/L (ref 19–32)
CO2: 29 mEq/L (ref 19–32)
Calcium: 7.8 mg/dL — ABNORMAL LOW (ref 8.4–10.5)
Calcium: 7.8 mg/dL — ABNORMAL LOW (ref 8.4–10.5)
Calcium: 7.9 mg/dL — ABNORMAL LOW (ref 8.4–10.5)
Chloride: 104 mEq/L (ref 96–112)
Chloride: 104 mEq/L (ref 96–112)
Chloride: 105 mEq/L (ref 96–112)
Creatinine, Ser: 0.91 mg/dL (ref 0.4–1.5)
Creatinine, Ser: 1.02 mg/dL (ref 0.4–1.5)
Creatinine, Ser: 1.04 mg/dL (ref 0.4–1.5)
GFR calc Af Amer: >60 mL/min (ref >60)
GFR calc Af Amer: >60 mL/min (ref >60)
GFR calc Af Amer: >60 mL/min (ref >60)
GFR calc non Af Amer: >60 mL/min (ref >60)
GFR calc non Af Amer: >60 mL/min (ref >60)
GFR calc non Af Amer: >60 mL/min (ref >60)
Glucose, Bld: 118 mg/dL — ABNORMAL HIGH (ref 70–99)
Glucose, Bld: 139 mg/dL — ABNORMAL HIGH (ref 70–99)
Glucose, Bld: 147 mg/dL — ABNORMAL HIGH (ref 70–99)
Potassium: 3.7 mEq/L (ref 3.5–5.1)
Potassium: 3.8 mEq/L (ref 3.5–5.1)
Potassium: 3.9 mEq/L (ref 3.5–5.1)
Sodium: 136 mEq/L (ref 135–145)
Sodium: 137 mEq/L (ref 135–145)
Sodium: 137 mEq/L (ref 135–145)

## 2010-12-15 LAB — CBC
HCT: 25.3 % — ABNORMAL LOW (ref 39.0–52.0)
HCT: 27 % — ABNORMAL LOW (ref 39.0–52.0)
HCT: 33.9 % — ABNORMAL LOW (ref 39.0–52.0)
Hemoglobin: 11.3 g/dL — ABNORMAL LOW (ref 13.0–17.0)
Hemoglobin: 8.6 g/dL — ABNORMAL LOW (ref 13.0–17.0)
Hemoglobin: 9.1 g/dL — ABNORMAL LOW (ref 13.0–17.0)
MCH: 30.7 pg (ref 26.0–34.0)
MCH: 31.1 pg (ref 26.0–34.0)
MCH: 31.3 pg (ref 26.0–34.0)
MCHC: 33.3 g/dL (ref 30.0–36.0)
MCHC: 33.7 g/dL (ref 30.0–36.0)
MCHC: 34 g/dL (ref 30.0–36.0)
MCV: 90.4 fL (ref 78.0–100.0)
MCV: 92.2 fL (ref 78.0–100.0)
MCV: 93.9 fL (ref 78.0–100.0)
Platelets: 109 10*3/uL — ABNORMAL LOW (ref 150–400)
Platelets: 112 10*3/uL — ABNORMAL LOW (ref 150–400)
Platelets: 131 10*3/uL — ABNORMAL LOW (ref 150–400)
RBC: 2.8 MIL/uL — ABNORMAL LOW (ref 4.22–5.81)
RBC: 2.93 MIL/uL — ABNORMAL LOW (ref 4.22–5.81)
RBC: 3.61 MIL/uL — ABNORMAL LOW (ref 4.22–5.81)
RDW: 12.9 % (ref 11.5–15.5)
RDW: 13.2 % (ref 11.5–15.5)
RDW: 13.4 % (ref 11.5–15.5)
WBC: 6.9 10*3/uL (ref 4.0–10.5)
WBC: 7.5 10*3/uL (ref 4.0–10.5)
WBC: 7.8 10*3/uL (ref 4.0–10.5)

## 2010-12-15 NOTE — Telephone Encounter (Signed)
Left message on machine on machine for patient to call back

## 2010-12-16 LAB — DIFFERENTIAL
Basophils Absolute: 0 10*3/uL (ref 0.0–0.1)
Basophils Relative: 0 % (ref 0–1)
Eosinophils Absolute: 0 10*3/uL (ref 0.0–0.7)
Eosinophils Relative: 1 % (ref 0–5)
Lymphocytes Relative: 19 % (ref 12–46)
Lymphs Abs: 1.1 10*3/uL (ref 0.7–4.0)
Monocytes Absolute: 0.5 10*3/uL (ref 0.1–1.0)
Monocytes Relative: 8 % (ref 3–12)
Neutro Abs: 4 10*3/uL (ref 1.7–7.7)
Neutrophils Relative %: 72 % (ref 43–77)

## 2010-12-16 LAB — BASIC METABOLIC PANEL
BUN: 12 mg/dL (ref 6–23)
CO2: 28 mEq/L (ref 19–32)
Calcium: 8.1 mg/dL — ABNORMAL LOW (ref 8.4–10.5)
Chloride: 108 mEq/L (ref 96–112)
Creatinine, Ser: 0.99 mg/dL (ref 0.4–1.5)
GFR calc Af Amer: >60 mL/min (ref >60)
GFR calc non Af Amer: >60 mL/min (ref >60)
Glucose, Bld: 151 mg/dL — ABNORMAL HIGH (ref 70–99)
Potassium: 4.3 mEq/L (ref 3.5–5.1)
Sodium: 139 mEq/L (ref 135–145)

## 2010-12-16 LAB — COMPREHENSIVE METABOLIC PANEL
ALT: 18 U/L (ref 0–53)
AST: 16 U/L (ref 0–37)
Albumin: 4.1 g/dL (ref 3.5–5.2)
Alkaline Phosphatase: 84 U/L (ref 39–117)
BUN: 23 mg/dL (ref 6–23)
CO2: 28 mEq/L (ref 19–32)
Calcium: 9 mg/dL (ref 8.4–10.5)
Chloride: 105 mEq/L (ref 96–112)
Creatinine, Ser: 1.8 mg/dL — ABNORMAL HIGH (ref 0.4–1.5)
GFR calc Af Amer: 46 mL/min — ABNORMAL LOW (ref >60)
GFR calc non Af Amer: 38 mL/min — ABNORMAL LOW (ref >60)
Glucose, Bld: 96 mg/dL (ref 70–99)
Potassium: 4.2 mEq/L (ref 3.5–5.1)
Sodium: 138 mEq/L (ref 135–145)
Total Bilirubin: 0.7 mg/dL (ref 0.3–1.2)
Total Protein: 6 g/dL (ref 6.0–8.3)

## 2010-12-16 LAB — CBC
HCT: 43.2 % (ref 39.0–52.0)
Hemoglobin: 14.7 g/dL (ref 13.0–17.0)
MCH: 30.9 pg (ref 26.0–34.0)
MCHC: 34 g/dL (ref 30.0–36.0)
MCV: 90.9 fL (ref 78.0–100.0)
Platelets: 148 10*3/uL — ABNORMAL LOW (ref 150–400)
RBC: 4.75 MIL/uL (ref 4.22–5.81)
RDW: 12.8 % (ref 11.5–15.5)
WBC: 5.6 10*3/uL (ref 4.0–10.5)

## 2010-12-16 LAB — URINE CULTURE
Colony Count: NO GROWTH
Culture  Setup Time: 201110271535
Culture: NO GROWTH

## 2010-12-16 LAB — URINALYSIS, ROUTINE W REFLEX MICROSCOPIC
Bilirubin Urine: NEGATIVE
Glucose, UA: NEGATIVE mg/dL
Hgb urine dipstick: NEGATIVE
Ketones, ur: NEGATIVE mg/dL
Nitrite: NEGATIVE
Protein, ur: NEGATIVE mg/dL
Specific Gravity, Urine: 1.027 (ref 1.005–1.030)
Urobilinogen, UA: 0.2 mg/dL (ref 0.0–1.0)
pH: 6 (ref 5.0–8.0)

## 2010-12-16 LAB — SURGICAL PCR SCREEN
MRSA, PCR: NEGATIVE
Staphylococcus aureus: NEGATIVE

## 2010-12-16 LAB — CROSSMATCH
ABO/RH(D): O POS
Antibody Screen: NEGATIVE
Unit division: 0
Unit division: 0

## 2010-12-16 LAB — PROTIME-INR
INR: 0.99 (ref 0.00–1.49)
Prothrombin Time: 13.3 seconds (ref 11.6–15.2)

## 2010-12-16 LAB — ABO/RH: ABO/RH(D): O POS

## 2010-12-16 LAB — APTT: aPTT: 32 seconds (ref 24–37)

## 2010-12-31 ENCOUNTER — Encounter (HOSPITAL_COMMUNITY)
Admission: RE | Admit: 2010-12-31 | Discharge: 2010-12-31 | Disposition: A | Discharge status: Home / Self Care | Payer: BC Managed Care – PPO | Source: Ambulatory Visit | Attending: Neurosurgery | Admitting: Neurosurgery

## 2010-12-31 LAB — CBC
HCT: 43.9 % (ref 39.0–52.0)
Hemoglobin: 14.7 g/dL (ref 13.0–17.0)
MCH: 28.9 pg (ref 26.0–34.0)
MCHC: 33.5 g/dL (ref 30.0–36.0)
MCV: 86.2 fL (ref 78.0–100.0)
Platelets: 166 10*3/uL (ref 150–400)
RBC: 5.09 MIL/uL (ref 4.22–5.81)
RDW: 15 % (ref 11.5–15.5)
WBC: 5.1 10*3/uL (ref 4.0–10.5)

## 2010-12-31 LAB — BASIC METABOLIC PANEL
BUN: 14 mg/dL (ref 6–23)
CO2: 29 mEq/L (ref 19–32)
Calcium: 9.2 mg/dL (ref 8.4–10.5)
Chloride: 103 mEq/L (ref 96–112)
Creatinine, Ser: 1.11 mg/dL (ref 0.4–1.5)
GFR calc Af Amer: >60 mL/min (ref >60)
GFR calc non Af Amer: >60 mL/min (ref >60)
Glucose, Bld: 94 mg/dL (ref 70–99)
Potassium: 4.2 mEq/L (ref 3.5–5.1)
Sodium: 139 mEq/L (ref 135–145)

## 2010-12-31 LAB — TYPE AND SCREEN
ABO/RH(D): O POS
Antibody Screen: NEGATIVE

## 2010-12-31 LAB — SURGICAL PCR SCREEN
MRSA, PCR: NEGATIVE
Staphylococcus aureus: NEGATIVE

## 2011-01-05 ENCOUNTER — Observation Stay (HOSPITAL_COMMUNITY)
Admission: RE | Admit: 2011-01-05 | Discharge: 2011-01-06 | Disposition: A | Discharge status: Home / Self Care | Payer: BC Managed Care – PPO | Source: Ambulatory Visit | Attending: Neurosurgery | Admitting: Neurosurgery

## 2011-01-05 ENCOUNTER — Inpatient Hospital Stay (HOSPITAL_COMMUNITY): Payer: BC Managed Care – PPO

## 2011-01-05 DIAGNOSIS — M5020 Other cervical disc displacement, unspecified cervical region: Principal | ICD-10-CM | POA: Insufficient documentation

## 2011-01-05 DIAGNOSIS — M47812 Spondylosis without myelopathy or radiculopathy, cervical region: Secondary | ICD-10-CM | POA: Insufficient documentation

## 2011-01-05 DIAGNOSIS — G20A1 Parkinson's disease without dyskinesia, without mention of fluctuations: Secondary | ICD-10-CM | POA: Insufficient documentation

## 2011-01-05 DIAGNOSIS — R209 Unspecified disturbances of skin sensation: Secondary | ICD-10-CM | POA: Insufficient documentation

## 2011-01-05 DIAGNOSIS — G20 Parkinson's disease: Secondary | ICD-10-CM | POA: Insufficient documentation

## 2011-01-05 DIAGNOSIS — M5030 Other cervical disc degeneration, unspecified cervical region: Secondary | ICD-10-CM | POA: Insufficient documentation

## 2011-01-07 NOTE — Op Note (Signed)
[name], [name]                  ACCOUNT NO.:  [id number]  MEDICAL RECORD NO.:  [id number]           PATIENT TYPE:  O  LOCATION:  3524                         FACILITY:  [facility]  PHYSICIAN:  Danae Orleans. Venetia Maxon, M.D.  DATE OF BIRTH:  08-Aug-1946  DATE OF PROCEDURE:  01/05/2011 DATE OF DISCHARGE:                              OPERATIVE REPORT   PREOPERATIVE DIAGNOSES:  Herniated cervical disk with cervical spondylosis, degenerative disk disease, and cervical radiculopathy C4- C5, C5-C6 and C6-C7 levels.  POSTOPERATIVE DIAGNOSES:  Herniated cervical disk with cervical spondylosis, degenerative disk disease, and cervical radiculopathy C4- C5, C5-C6 and C6-C7 levels.  PROCEDURES:  Anterior cervical decompression and fusion, C4-C5, C5-C6, and C6-C7 levels with PEEK interbody cages, morselized bone autograft, allograft with PureGen, and anterior cervical plate.  SURGEON:  Danae Orleans. Venetia Maxon, MD  ASSISTANTS:  Georgiann Cocker, RN, and Coletta Memos, MD ANESTHESIA:  General endotracheal anesthesia.  ESTIMATED BLOOD LOSS:  Minimal.  COMPLICATIONS:  None.  DISPOSITION:  Recovery.  INDICATIONS:  Francisco Gutierrez is a 65 year old man with a profound right upper extremity weakness and atrophy principally involving C6 nerve root with marked spinal spondylosis and foraminal stenosis C4-C5, C5-C6, and C6-C7 levels so it was elected to take him to surgery for anterior cervical decompression and fusion at these affected levels.  PROCEDURE:  Francisco Gutierrez was brought to the operating room.  Following satisfactory and uncomplicated induction of general endotracheal anesthesia and placement of intravenous lines, the patient was placed in supine position on the operating table.  Neck was placed in slight extension.  He was placed in 5 pounds Halter traction.  The anterior neck was then prepped and draped in usual sterile fashion.  Area of planned incision was infiltrated with local lidocaine.  Incision was made  in the middle neck creases on the left side of midline from midline to the anterior border of sternocleidomastoid muscle.  Using blunt dissection, the carotid sheath was kept lateral and the trachea and esophagus was kept medial exposing the anterior cervical spine.  A bent spinal needle was placed at what was felt to be the C4-C5 level and this was confirmed on intraoperative x-ray.  Subsequently, the longus colli muscles were taken down from the anterior cervical spine from C4-C7 levels bilaterally using electrocautery and Key elevator.  Self- retaining shadow line retractor was placed to facilitate exposure. Large ventral osteophyte at C5-C6 was removed.  The interspace at C4-C5, C5-C6 and C6-C7 were then incised and disk material was removed in piecemeal fashion.  Using distraction pins at C4 and C5, the interspace was opened.  Thorough diskectomy was performed with removal of large uncinate spurs particularly on the right side of midline and the C5 nerve root was widely decompressed.  Spinal cord dura was decompressed as well.  Hemostasis was then assured and after trial sizing a 5-mm PEEK interbody cage, it was packed with morselized bone autograft and ProFuse soaked in PureGen stem cells.  This was then tamped into position and countersunk appropriately.  The attention was then turned to the C5-C6 and subsequently the C6-C7 levels and thorough decompression was  performed.  The right C6 nerve root appeared to be very severely compressed with both a combination of stenosis and herniated disk causing marked C6 nerve root compression.  After trial sizing, we elected to use a 7-mm cage packed in a similar fashion at this level, and at the C6-7 level there was severe nerve root compression, virtually ankylosed joint at this level which was carefully opened up with removal of uncinate spurs, and the spinal cord dura and both C7 nerve roots were widely decompressed.  After trial sizing a  5-mm PEEK interbody cage, it was packed in similar fashion, tamped into position, countersunk appropriately.  Traction weight was removed.  A 54-mm Trestle anterior cervical plate was fixed to the anterior cervical spine using 14-mm variable angle screws, two at C4, two at C5, two at C6, and two at C7. All screws had excellent purchase.  Locking mechanisms were engaged. Final x-ray demonstrated well-positioned interbody graft and the anterior cervical plate.  Soft tissues were inspected and found to be in good repair.  Hemostasis was assured.  The platysmal layer was closed with 3-0 Vicryl sutures.  The skin edges reapproximated with 3-0 Vicryl subcuticular stitch.  Wound was dressed with Dermabond.  The patient was extubated in the operating room, taken to the recovery in stable, satisfactory condition having tolerated his operation well.  Counts correct at the end of the case.     Danae Orleans. Venetia Maxon, M.D.     JDS/MEDQ  D:  01/05/2011  T:  01/06/2011  Job:  [id number]  Electronically Signed by Maeola Harman M.D. on 01/07/2011 09:54:45 AM

## 2011-04-01 IMAGING — RF DG MYELOGRAM CERVICAL
12 series · 12 of 12 positions shown · non-contrast
Comparison: none

CLINICAL DATA: Cervical radiculopathy.  Upper extremity weakness.
TECHNIQUE: Contiguous axial images were obtained through the
Cervical spine without infusion. Coronal and sagittal
reconstructions were obtained of the axial image sets.

[Series 1: (hospital) · 1 of 1 slices shown (1 of 3)]
[im 1/1]
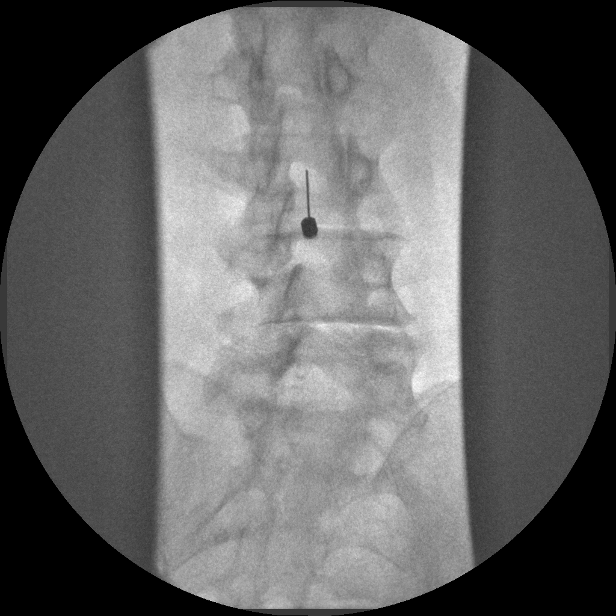

[Series 2: (hospital) · 1 of 1 slices shown (2 of 3)]
[im 1/1]
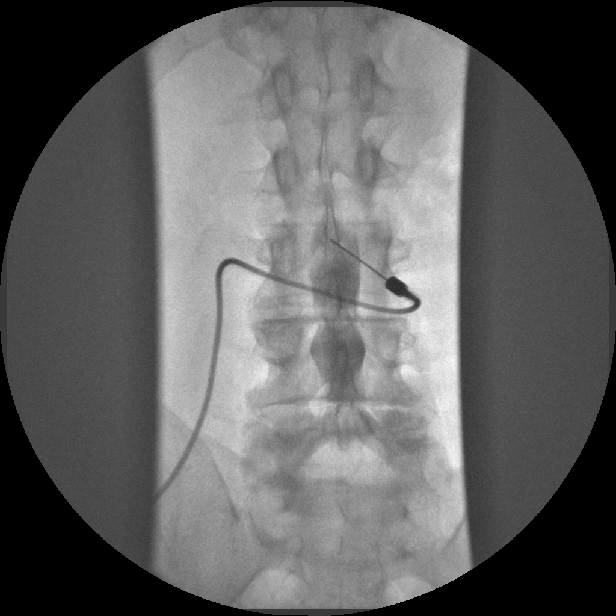

[Series 3: myelogram  white · 1 of 1 slices shown (1 of 9)]
[im 1/1]
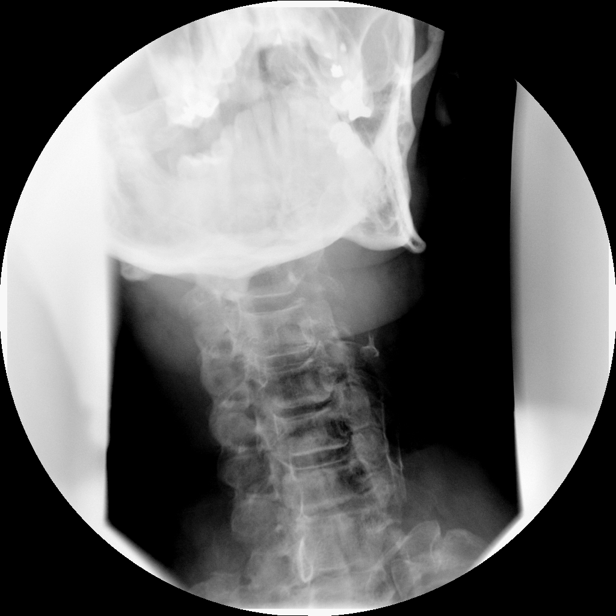

[Series 4: myelogram  white · 1 of 1 slices shown (2 of 9)]
[im 1/1]
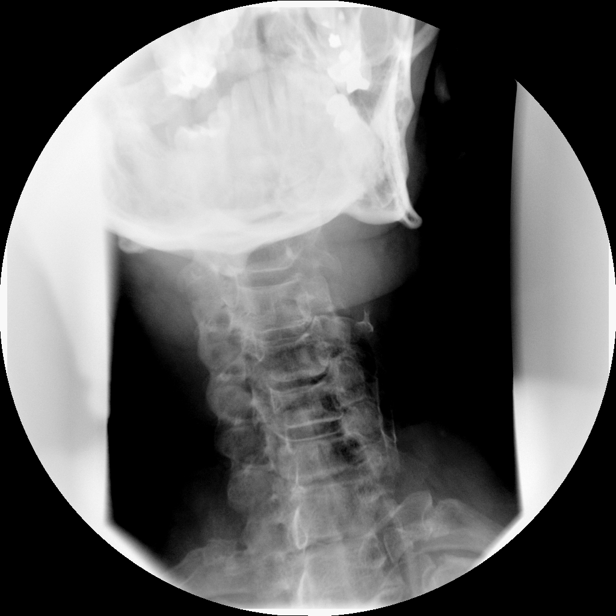

[Series 5: myelogram  white · 1 of 1 slices shown (3 of 9)]
[im 1/1]
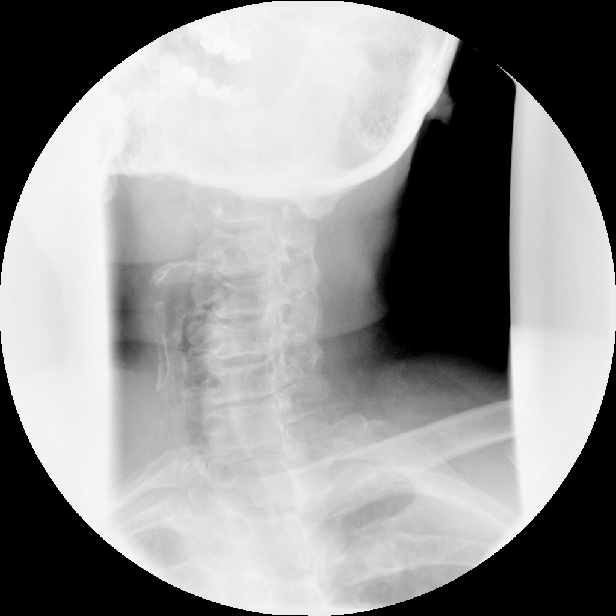

[Series 6: myelogram  white · 1 of 1 slices shown (4 of 9)]
[im 1/1]
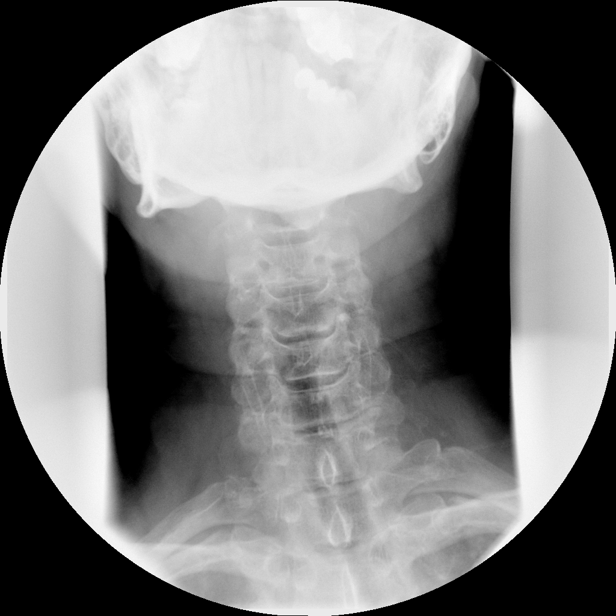

[Series 7: myelogram  white · 1 of 1 slices shown (5 of 9)]
[im 1/1]
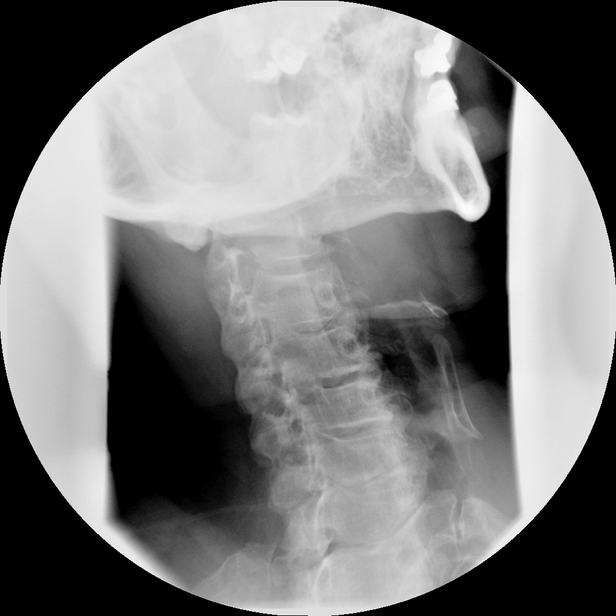

[Series 9: myelogram  white · 1 of 1 slices shown (6 of 9)]
[im 1/1]
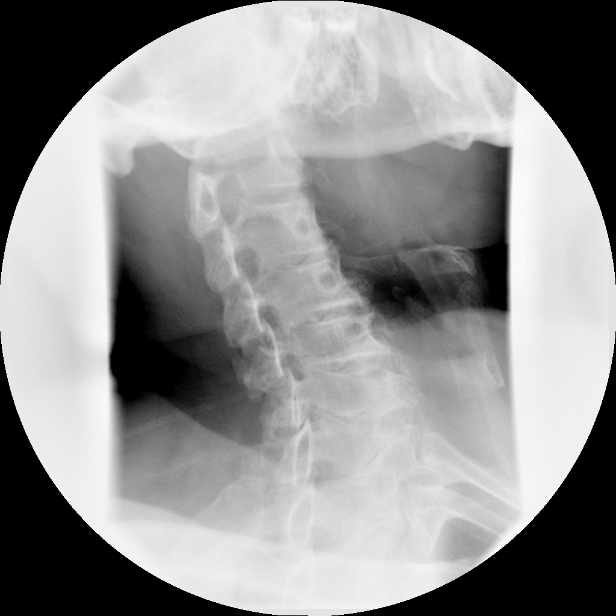

[Series 10: myelogram  white · 1 of 1 slices shown (7 of 9)]
[im 1/1]
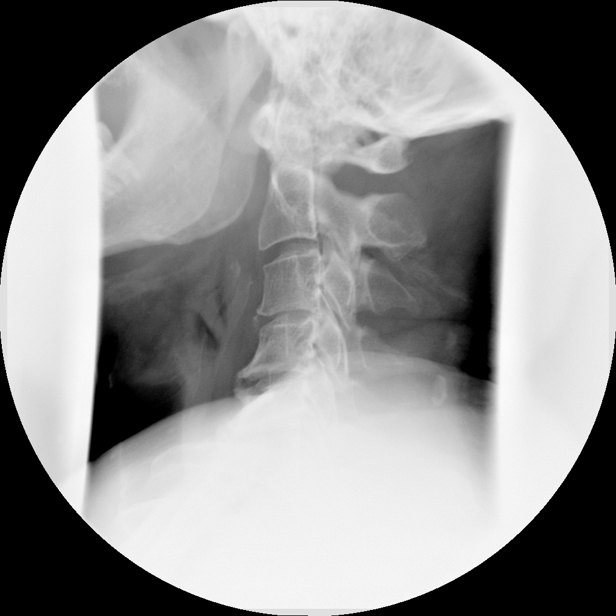

[Series 11: myelogram  white · 1 of 1 slices shown (8 of 9)]
[im 1/1]
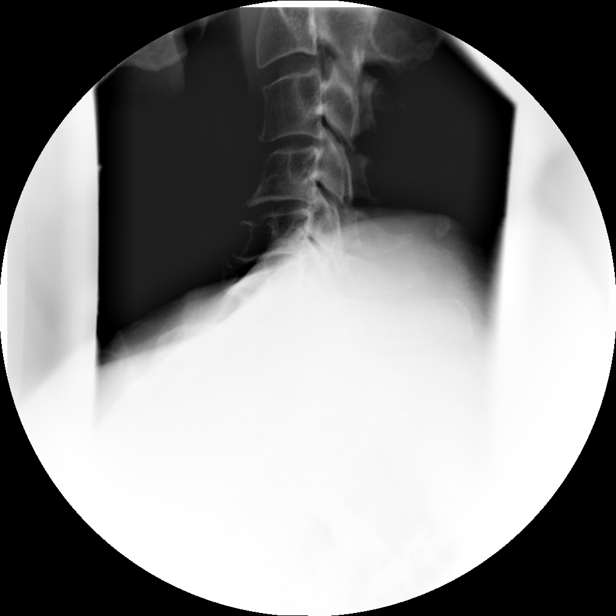

[Series 12: (hospital) · 1 of 1 slices shown (3 of 3)]
[im 1/1]
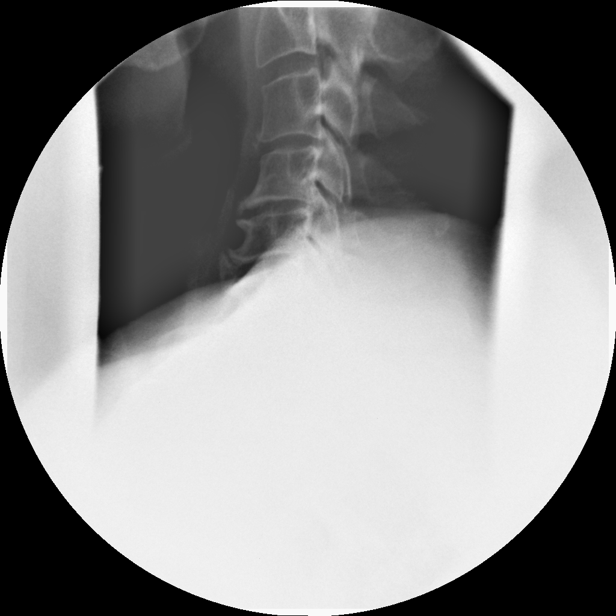

[Series 13: myelogram  white · 1 of 1 slices shown (9 of 9)]
[im 1/1]
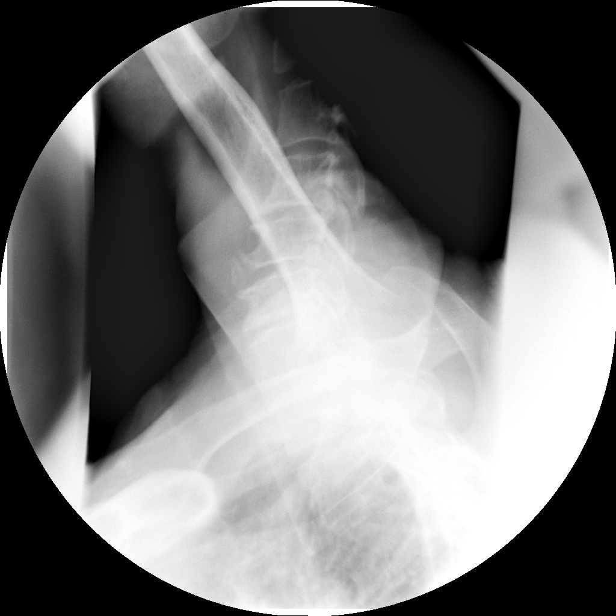

[12 of 12 positions shown; findings below may reference images not displayed]

LUMBAR PUNCTURE FOR CERVICAL MYELOGRAM

 Procedure: After thorough discussion of risks and benefits of the
procedure including bleeding, infection, injury to nerves, blood
vessels, adjacent structures as well as headache and CSF leak,
written and oral informed consent was obtained.   Consent was
obtained by Dr.Epiphania.

Patient was positioned prone on the fluoroscopy table. Local
anesthesia was provided with 1% lidocaine without epinephrine after
prepped and draped in the usual sterile fashion. Puncture was
performed at L3-L4 using a 3-1/2 inches inch 22-gauge spinal needle
via right paramedian approach.  Using a single pass through the
dura, the needle was placed within the thecal sac, with return of
clear CSF. 8 mL of [id number] was injected into the thecal sac,
with normal opacification of the nerve roots and cauda equina
consistent with free flow within the subarachnoid space.

Fluoroscopy time: 1 minute 10 seconds
FINDINGS: Cervical spondylosis is present with degenerative disc
disease most pronounced at C4-C5 with anterior osteophytes.
Cervical myelographic contrast is suboptimally visualized.  See CT
below.
IMPRESSION: 1.  Technically successful lumbar puncture for cervical myelogram.
2.  Suboptimal myelographic visualization of contrast in the
cervical spine.

CT CERVICAL MYELOGRAM
FINDINGS: There is reversal of the normal cervical lordosis at C4-
C5.  Multilevel mid to lower cervical spondylosis is present which
will be described below.  Craniocervical alignment normal.
Paraspinal soft tissues appear within normal limits.

C2-C3:  Negative.
C3-C4:  There is a small right posterolateral disc protrusion which
is partially calcified.  Mild bilateral uncovertebral spurring is
present without significant foraminal stenosis.  The right
paracentral disc protrusion contacts the ventral aspect of the
right side of the cervical cord, deforming it.
C4-C5:  Broad-based disc osteophyte complex is present with
moderate central stenosis.  AP diameter of the thecal sac measures
8.5 mm.  There is flattening of the ventral cervical cord.
Bilateral uncovertebral spurring is present with symmetric
bilateral foraminal stenosis.  Mild distraction of the facet
joints.
C5-C6: Broad-based disc osteophyte complex is present with mild
central stenosis.  Bilateral uncovertebral spurring is present with
bilateral foraminal stenosis.
C6-C7:  Broad-based disc osteophyte complex with mild to moderate
central stenosis.  Narrowing of the ventral subarachnoid space.
There is a large right foraminal osteophyte protruding into the C6-
C7 foramen, likely compressing the exiting right C7 nerve.  Mild
moderate left foraminal stenosis is present secondary uncovertebral
spurring.
C7-T1:  There is a small right paracentral endplate osteophyte
producing minimal central stenosis.  No flattening of the cord.
The neural foramina appear patent.
IMPRESSION: 1.  C3-C4 small right posterolateral disc protrusion with mild
central stenosis.
2.  C4-C5 broad-based disc osteophyte complex with moderate central
stenosis.  Flattening the ventral cord.  Bilateral uncovertebral
spurring with bilateral foraminal stenosis.
3.  C6-C7 broad-based  disc osteophyte complex with mild to
moderate central stenosis.  Large right foraminal osteophyte
protruding into the C6-C7 foramen, likely compressing the exiting
right C7 nerve.
4.  Small right paracentral C7-T1 osteophyte with minimal
impression on the thecal sac.

## 2011-05-24 ENCOUNTER — Other Ambulatory Visit (INDEPENDENT_AMBULATORY_CARE_PROVIDER_SITE_OTHER): Payer: BC Managed Care – PPO

## 2011-05-24 DIAGNOSIS — Z0000 Encounter for general adult medical examination without abnormal findings: Secondary | ICD-10-CM

## 2011-05-24 LAB — CBC WITH DIFFERENTIAL/PLATELET
Basophils Absolute: 0 10*3/uL (ref 0.0–0.1)
Basophils Relative: 0.5 % (ref 0.0–3.0)
Eosinophils Absolute: 0.1 10*3/uL (ref 0.0–0.7)
Eosinophils Relative: 1.1 % (ref 0.0–5.0)
HCT: 46.2 % (ref 39.0–52.0)
Hemoglobin: 15.4 g/dL (ref 13.0–17.0)
Lymphocytes Relative: 23.1 % (ref 12.0–46.0)
Lymphs Abs: 1.2 10*3/uL (ref 0.7–4.0)
MCHC: 33.3 g/dL (ref 30.0–36.0)
MCV: 92.7 fl (ref 78.0–100.0)
Monocytes Absolute: 0.4 10*3/uL (ref 0.1–1.0)
Monocytes Relative: 7.6 % (ref 3.0–12.0)
Neutro Abs: 3.6 10*3/uL (ref 1.4–7.7)
Neutrophils Relative %: 67.7 % (ref 43.0–77.0)
Platelets: 156 10*3/uL (ref 150.0–400.0)
RBC: 4.99 Mil/uL (ref 4.22–5.81)
RDW: 13.9 % (ref 11.5–14.6)
WBC: 5.3 10*3/uL (ref 4.5–10.5)

## 2011-05-24 LAB — POCT URINALYSIS DIPSTICK
Bilirubin, UA: NEGATIVE
Blood, UA: NEGATIVE
Glucose, UA: NEGATIVE
Ketones, UA: NEGATIVE
Leukocytes, UA: NEGATIVE
Nitrite, UA: NEGATIVE
Protein, UA: NEGATIVE
Spec Grav, UA: 1.03
Urobilinogen, UA: 0.2
pH, UA: 5

## 2011-05-24 LAB — LIPID PANEL
Cholesterol: 207 mg/dL — ABNORMAL HIGH (ref 0–200)
HDL: 61.2 mg/dL (ref >39.00)
Total CHOL/HDL Ratio: 3
Triglycerides: 53 mg/dL (ref 0.0–149.0)
VLDL: 10.6 mg/dL (ref 0.0–40.0)

## 2011-05-24 LAB — HEPATIC FUNCTION PANEL
ALT: 13 U/L (ref 0–53)
AST: 16 U/L (ref 0–37)
Albumin: 4.1 g/dL (ref 3.5–5.2)
Alkaline Phosphatase: 97 U/L (ref 39–117)
Bilirubin, Direct: 0.1 mg/dL (ref 0.0–0.3)
Total Bilirubin: 1 mg/dL (ref 0.3–1.2)
Total Protein: 6.3 g/dL (ref 6.0–8.3)

## 2011-05-24 LAB — BASIC METABOLIC PANEL
BUN: 15 mg/dL (ref 6–23)
CO2: 31 mEq/L (ref 19–32)
Calcium: 9 mg/dL (ref 8.4–10.5)
Chloride: 105 mEq/L (ref 96–112)
Creatinine, Ser: 1.1 mg/dL (ref 0.4–1.5)
GFR: 70.93 mL/min (ref >60.00)
Glucose, Bld: 96 mg/dL (ref 70–99)
Potassium: 4.7 mEq/L (ref 3.5–5.1)
Sodium: 142 mEq/L (ref 135–145)

## 2011-05-24 LAB — TSH: TSH: 1.77 u[IU]/mL (ref 0.35–5.50)

## 2011-05-24 LAB — PSA: PSA: 0.39 ng/mL (ref 0.10–4.00)

## 2011-05-24 LAB — LDL CHOLESTEROL, DIRECT: Direct LDL: 137.4 mg/dL

## 2011-05-31 ENCOUNTER — Ambulatory Visit (INDEPENDENT_AMBULATORY_CARE_PROVIDER_SITE_OTHER): Payer: BC Managed Care – PPO | Admitting: Family Medicine

## 2011-05-31 ENCOUNTER — Encounter: Payer: Self-pay | Admitting: Family Medicine

## 2011-05-31 DIAGNOSIS — G20 Parkinson's disease: Secondary | ICD-10-CM

## 2011-05-31 DIAGNOSIS — IMO0002 Reserved for concepts with insufficient information to code with codable children: Secondary | ICD-10-CM

## 2011-05-31 DIAGNOSIS — M1710 Unilateral primary osteoarthritis, unspecified knee: Secondary | ICD-10-CM

## 2011-05-31 DIAGNOSIS — Z0000 Encounter for general adult medical examination without abnormal findings: Secondary | ICD-10-CM

## 2011-05-31 DIAGNOSIS — F528 Other sexual dysfunction not due to a substance or known physiological condition: Secondary | ICD-10-CM

## 2011-05-31 DIAGNOSIS — M542 Cervicalgia: Secondary | ICD-10-CM

## 2011-05-31 DIAGNOSIS — E785 Hyperlipidemia, unspecified: Secondary | ICD-10-CM

## 2011-05-31 MED ORDER — SILDENAFIL CITRATE 50 MG PO TABS: 50.0000 mg | ORAL_TABLET | Freq: Every day | ORAL | Status: DC | PRN

## 2011-05-31 NOTE — Progress Notes (Signed)
  Subjective:    Patient ID: Francisco Gutierrez, male    DOB: 27-Dec-1945, 65 y.o.   MRN: [id number]  HPI Francisco Gutierrez Is a 65 year old, married male, nonsmoker, who has underlying mild Parkinson's disease, who comes in today for general physical examination.  This past year.  He had a total left knee replacement by Dr. Homero Fellers and a cervical disk repair by Dr. Gae Dry.  Both surgeries were successful.  No complications.  He sees his neurologist on an every 4 month basis.  He gets routine eye care, dental care, colonoscopy, 2009 normal, tetanus, 2007, information given on shingles.  Is neurologic.  Medications are written by his neurologist.  We right hand.  The Viagra for erectile dysfunction.  His hyperlipidemia has been controlled with diet and exercise.     Review of Systems  Constitutional: Negative.   HENT: Negative.   Eyes: Negative.   Respiratory: Negative.   Cardiovascular: Negative.   Gastrointestinal: Negative.   Genitourinary: Negative.   Musculoskeletal: Negative.   Skin: Negative.   Neurological: Negative.   Hematological: Negative.   Psychiatric/Behavioral: Negative.        Objective:   Physical Exam  Constitutional: He is oriented to person, place, and time. He appears well-developed and well-nourished.  HENT:  Head: Normocephalic and atraumatic.  Right Ear: External ear normal.  Left Ear: External ear normal.  Nose: Nose normal.  Mouth/Throat: Oropharynx is clear and moist.  Eyes: Conjunctivae and EOM are normal. Pupils are equal, round, and reactive to light.  Neck: Normal range of motion. Neck supple. No JVD present. No tracheal deviation present. No thyromegaly present.  Cardiovascular: Normal rate, regular rhythm, normal heart sounds and intact distal pulses.  Exam reveals no gallop and no friction rub.   No murmur heard. Pulmonary/Chest: Effort normal and breath sounds normal. No stridor. No respiratory distress. He has no wheezes. He has no rales. He exhibits  no tenderness.  Abdominal: Soft. Bowel sounds are normal. He exhibits no distension and no mass. There is no tenderness. There is no rebound and no guarding.  Genitourinary: Rectum normal, prostate normal and penis normal. Guaiac negative stool. No penile tenderness.  Musculoskeletal: Normal range of motion. He exhibits no edema and no tenderness.  Lymphadenopathy:    He has no cervical adenopathy.  Neurological: He is alert and oriented to person, place, and time. He has normal reflexes. No cranial nerve deficit. He exhibits normal muscle tone.       Slight resting tremor consistent with his Parkinson's disease  Skin: Skin is warm and dry. No rash noted. No erythema. No pallor.       Total body skin exam normal.  Scar, left neck, and her left knee from previous surgery.  Last year  Psychiatric: He has a normal mood and affect. His behavior is normal. Judgment and thought content normal.          Assessment & Plan:  Healthy male.  History of Parkinson's disease.  Continue follow-up by neurology.  History of erectile dysfunction continue Viagra p.r.n.  History of hyperlipidemia, controlled with diet and exercise

## 2011-05-31 NOTE — Patient Instructions (Signed)
Continue your current exercise and good diet.  Return in one year, sooner for any problems.  Strongly considered.  The shingles.  Vaccination

## 2011-08-06 ENCOUNTER — Other Ambulatory Visit: Payer: Self-pay | Admitting: Family Medicine

## 2011-08-10 ENCOUNTER — Telehealth: Payer: Self-pay | Admitting: Family Medicine

## 2011-08-10 NOTE — Telephone Encounter (Signed)
Pt brought by a Health Record form yesterday 08/09/11. 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 Pt will pick up completed and signed form this wk. Pls call.

## 2011-08-10 NOTE — Telephone Encounter (Signed)
Left message on machine for patient  That form is ready for pick up.  However she may require a PPD placed

## 2011-08-11 ENCOUNTER — Ambulatory Visit (INDEPENDENT_AMBULATORY_CARE_PROVIDER_SITE_OTHER): Payer: BC Managed Care – PPO | Admitting: Family Medicine

## 2011-08-11 DIAGNOSIS — Z111 Encounter for screening for respiratory tuberculosis: Secondary | ICD-10-CM

## 2011-08-13 LAB — TB SKIN TEST
Induration: 0
TB Skin Test: NEGATIVE mm

## 2012-01-12 ENCOUNTER — Encounter: Payer: Self-pay | Admitting: Family Medicine

## 2012-01-12 ENCOUNTER — Ambulatory Visit (INDEPENDENT_AMBULATORY_CARE_PROVIDER_SITE_OTHER): Payer: BC Managed Care – PPO | Admitting: Family Medicine

## 2012-01-12 ENCOUNTER — Telehealth: Payer: Self-pay | Admitting: Family Medicine

## 2012-01-12 VITALS — BP 120/80 | Temp 98.1°F | Wt 206.0 lb

## 2012-01-12 DIAGNOSIS — W57XXXA Bitten or stung by nonvenomous insect and other nonvenomous arthropods, initial encounter: Secondary | ICD-10-CM | POA: Insufficient documentation

## 2012-01-12 DIAGNOSIS — T148XXA Other injury of unspecified body region, initial encounter: Secondary | ICD-10-CM

## 2012-01-12 DIAGNOSIS — T148 Other injury of unspecified body region: Secondary | ICD-10-CM

## 2012-01-12 NOTE — Telephone Encounter (Signed)
Pt has an embedded tick on back of leg. Req to get work in with Dr Tawanna Cooler or see any doctor.

## 2012-01-12 NOTE — Patient Instructions (Signed)
Change the Band-Aid twice daily for a couple days  Francisco Gutierrez your calendar within 2 weeks if you run any fever call immediately

## 2012-01-12 NOTE — Progress Notes (Signed)
  Subjective:    Patient ID: Francisco Gutierrez, male    DOB: 1945/10/11, 66 y.o.   MRN: [id number]  HPI Casy is a 66 year old retired male nonsmoker who comes in today for evaluation of a tick bite  He found a couple of ticks on him yesterday he was able to remove them except for one on his left posterior thigh   Review of Systems    review of systems negative Objective:   Physical Exam Well-developed well-nourished male in acute distress examination of the leg shows a 3 mm area of erythema was cleaned no tick present       Assessment & Plan:  Tick bite plan local therapy return when necessary [place] calendar for 2 weeks if any fever call immediately

## 2012-07-27 ENCOUNTER — Other Ambulatory Visit (INDEPENDENT_AMBULATORY_CARE_PROVIDER_SITE_OTHER): Payer: BC Managed Care – PPO

## 2012-07-27 DIAGNOSIS — Z0000 Encounter for general adult medical examination without abnormal findings: Secondary | ICD-10-CM

## 2012-07-27 LAB — BASIC METABOLIC PANEL
BUN: 17 mg/dL (ref 6–23)
CO2: 27 mEq/L (ref 19–32)
Calcium: 8.9 mg/dL (ref 8.4–10.5)
Chloride: 106 mEq/L (ref 96–112)
Creatinine, Ser: 1.1 mg/dL (ref 0.4–1.5)
GFR: 69.23 mL/min (ref >60.00)
Glucose, Bld: 79 mg/dL (ref 70–99)
Potassium: 4.4 mEq/L (ref 3.5–5.1)
Sodium: 141 mEq/L (ref 135–145)

## 2012-07-27 LAB — POCT URINALYSIS DIPSTICK
Bilirubin, UA: NEGATIVE
Blood, UA: NEGATIVE
Glucose, UA: NEGATIVE
Ketones, UA: NEGATIVE
Leukocytes, UA: NEGATIVE
Nitrite, UA: NEGATIVE
Protein, UA: NEGATIVE
Spec Grav, UA: >=1.03
Urobilinogen, UA: 0.2
pH, UA: 5.5

## 2012-07-27 LAB — CBC WITH DIFFERENTIAL/PLATELET
Basophils Absolute: 0 10*3/uL (ref 0.0–0.1)
Basophils Relative: 0.5 % (ref 0.0–3.0)
Eosinophils Absolute: 0.1 10*3/uL (ref 0.0–0.7)
Eosinophils Relative: 1.3 % (ref 0.0–5.0)
HCT: 47.6 % (ref 39.0–52.0)
Hemoglobin: 15.8 g/dL (ref 13.0–17.0)
Lymphocytes Relative: 17 % (ref 12.0–46.0)
Lymphs Abs: 1.2 10*3/uL (ref 0.7–4.0)
MCHC: 33.1 g/dL (ref 30.0–36.0)
MCV: 92.6 fl (ref 78.0–100.0)
Monocytes Absolute: 0.5 10*3/uL (ref 0.1–1.0)
Monocytes Relative: 7.1 % (ref 3.0–12.0)
Neutro Abs: 5.3 10*3/uL (ref 1.4–7.7)
Neutrophils Relative %: 74.1 % (ref 43.0–77.0)
Platelets: 159 10*3/uL (ref 150.0–400.0)
RBC: 5.15 Mil/uL (ref 4.22–5.81)
RDW: 12.9 % (ref 11.5–14.6)
WBC: 7.1 10*3/uL (ref 4.5–10.5)

## 2012-07-27 LAB — HEPATIC FUNCTION PANEL
ALT: 18 U/L (ref 0–53)
AST: 17 U/L (ref 0–37)
Albumin: 3.9 g/dL (ref 3.5–5.2)
Alkaline Phosphatase: 85 U/L (ref 39–117)
Bilirubin, Direct: 0.1 mg/dL (ref 0.0–0.3)
Total Bilirubin: 0.8 mg/dL (ref 0.3–1.2)
Total Protein: 6.5 g/dL (ref 6.0–8.3)

## 2012-07-27 LAB — LIPID PANEL
Cholesterol: 209 mg/dL — ABNORMAL HIGH (ref 0–200)
HDL: 55 mg/dL (ref >39.00)
Total CHOL/HDL Ratio: 4
Triglycerides: 54 mg/dL (ref 0.0–149.0)
VLDL: 10.8 mg/dL (ref 0.0–40.0)

## 2012-07-28 ENCOUNTER — Encounter: Payer: Self-pay | Admitting: Family

## 2012-07-28 ENCOUNTER — Ambulatory Visit (INDEPENDENT_AMBULATORY_CARE_PROVIDER_SITE_OTHER): Payer: BC Managed Care – PPO | Admitting: Family

## 2012-07-28 VITALS — BP 132/90 | HR 60 | Temp 97.4°F | Wt 200.0 lb

## 2012-07-28 DIAGNOSIS — J209 Acute bronchitis, unspecified: Secondary | ICD-10-CM

## 2012-07-28 DIAGNOSIS — R05 Cough: Secondary | ICD-10-CM

## 2012-07-28 DIAGNOSIS — R059 Cough, unspecified: Secondary | ICD-10-CM

## 2012-07-28 MED ORDER — METHYLPREDNISOLONE 4 MG PO KIT: PACK | ORAL | Status: DC

## 2012-07-28 MED ORDER — PSEUDOEPHEDRINE-CODEINE-GG 30-10-100 MG/5ML PO SOLN: 10.0000 mL | Freq: Four times a day (QID) | ORAL | Status: DC | PRN

## 2012-07-28 NOTE — Patient Instructions (Signed)

## 2012-07-28 NOTE — Progress Notes (Signed)
  Subjective:    Patient ID: Francisco Gutierrez, male    DOB: 1946-08-19, 66 y.o.   MRN: [id number]  HPI 66 year old white male, nonsmoker, patient of Dr. Tawanna Cooler is in today with complaints of a cough x2 weeks. Describes the cough is nonproductive accompanied by hoarseness. He denies any sneezing, congestion, fever, muscle aches or pain, no heartburn or indigestion. He has not taken any medications over-the-counter for relief.   Review of Systems  Constitutional: Negative.   HENT: Negative.   Respiratory: Positive for cough.   Cardiovascular: Negative.   Gastrointestinal: Negative.  Negative for abdominal pain and abdominal distention.  Musculoskeletal: Negative.   Skin: Negative.   Neurological: Negative.   Hematological: Negative.   Psychiatric/Behavioral: Negative.    Past Medical History  Diagnosis Date  . Hyperlipidemia   . Parkinson disease     History   Social History  . Marital Status: Married    Spouse Name: N/A    Number of Children: N/A  . Years of Education: N/A   Occupational History  . Not on file.   Social History Main Topics  . Smoking status: Former Games developer  . Smokeless tobacco: Not on file  . Alcohol Use: No  . Drug Use: No  . Sexually Active:    Other Topics Concern  . Not on file   Social History Narrative  . No narrative on file    Past Surgical History  Procedure Date  . Hernia repair     bilateral    Family History  Problem Relation Age of Onset  . Dementia Mother   . Stroke Father   . Cancer Father     colon cancer    No Known Allergies  Current Outpatient Prescriptions on File Prior to Visit  Medication Sig Dispense Refill  . aspirin 81 MG tablet Take 81 mg by mouth daily.        Marland Kitchen co-enzyme Q-10 30 MG capsule Take 30 mg by mouth daily.        . Rasagiline Mesylate (AZILECT) 1 MG TABS Take by mouth.        Marland Kitchen rOPINIRole (REQUIP XL) 8 MG 24 hr tablet Take 8 mg by mouth at bedtime.        . sildenafil (VIAGRA) 50 MG tablet Take 1  tablet (50 mg total) by mouth daily as needed.  6 tablet  11  . amantadine (SYMMETREL) 100 MG capsule TAKE 1 TABLET BY MOUTH TWICE A DAY  200 capsule  3    BP 132/90  Pulse 60  Temp 97.4 F (36.3 C) (Oral)  Wt 200 lb (90.719 kg)  SpO2 98%chart    Objective:   Physical Exam  Constitutional: He is oriented to person, place, and time. He appears well-developed and well-nourished.  Neck: Normal range of motion. Neck supple.  Cardiovascular: Normal rate, regular rhythm and normal heart sounds.   Pulmonary/Chest: Effort normal and breath sounds normal.  Abdominal: Soft. Bowel sounds are normal.  Neurological: He is alert and oriented to person, place, and time.  Skin: Skin is warm and dry.  Psychiatric: He has a normal mood and affect.          Assessment & Plan:  Assessment: Bronchitis, cough  Plan: Medrol dosepak as directed. Cough syrup as directed. Call the office if symptoms worsen or persist. Recheck as scheduled and sooner as needed.

## 2012-07-31 LAB — TSH: TSH: 1.82 u[IU]/mL (ref 0.35–5.50)

## 2012-07-31 LAB — LDL CHOLESTEROL, DIRECT: Direct LDL: 145.9 mg/dL

## 2012-07-31 LAB — PSA: PSA: 0.64 ng/mL (ref 0.10–4.00)

## 2012-08-03 ENCOUNTER — Ambulatory Visit (INDEPENDENT_AMBULATORY_CARE_PROVIDER_SITE_OTHER): Payer: BC Managed Care – PPO | Admitting: Family Medicine

## 2012-08-03 ENCOUNTER — Encounter: Payer: Self-pay | Admitting: Family Medicine

## 2012-08-03 VITALS — BP 120/88 | Temp 97.7°F | Ht 70.0 in | Wt 201.0 lb

## 2012-08-03 DIAGNOSIS — Z136 Encounter for screening for cardiovascular disorders: Secondary | ICD-10-CM

## 2012-08-03 DIAGNOSIS — Z0000 Encounter for general adult medical examination without abnormal findings: Secondary | ICD-10-CM

## 2012-08-03 DIAGNOSIS — F528 Other sexual dysfunction not due to a substance or known physiological condition: Secondary | ICD-10-CM

## 2012-08-03 DIAGNOSIS — K4090 Unilateral inguinal hernia, without obstruction or gangrene, not specified as recurrent: Secondary | ICD-10-CM

## 2012-08-03 DIAGNOSIS — IMO0002 Reserved for concepts with insufficient information to code with codable children: Secondary | ICD-10-CM

## 2012-08-03 DIAGNOSIS — G20 Parkinson's disease: Secondary | ICD-10-CM

## 2012-08-03 DIAGNOSIS — M1710 Unilateral primary osteoarthritis, unspecified knee: Secondary | ICD-10-CM

## 2012-08-03 NOTE — Progress Notes (Signed)
  Subjective:    Patient ID: Francisco Gutierrez, male    DOB: 1945-10-09, 66 y.o.   MRN: [id number]  HPI Francisco Gutierrez is a 66 year old married male nonsmoker who comes in today for general physical examination  He has a history of underlying Parkinson's disease and is followed carefully and neurology. He's currently on amantadine 100 mg twice a day, reprepped 8 mg each bedtime, andazilect 1 mg daily  He gets routine eye care, dental care, colonoscopy and GI, tetanus 2007, Pneumovax 2010, information given on shingles, declined flu shot   Review of Systems  Constitutional: Negative.   HENT: Negative.   Eyes: Negative.   Respiratory: Negative.   Cardiovascular: Negative.   Gastrointestinal: Negative.   Genitourinary: Negative.   Musculoskeletal: Negative.   Skin: Negative.   Neurological: Negative.   Hematological: Negative.   Psychiatric/Behavioral: Negative.        Objective:   Physical Exam  Constitutional: He is oriented to person, place, and time. He appears well-developed and well-nourished.  HENT:  Head: Normocephalic and atraumatic.  Right Ear: External ear normal.  Left Ear: External ear normal.  Nose: Nose normal.  Mouth/Throat: Oropharynx is clear and moist.  Eyes: Conjunctivae normal and EOM are normal. Pupils are equal, round, and reactive to light.  Neck: Normal range of motion. Neck supple. No JVD present. No tracheal deviation present. No thyromegaly present.  Cardiovascular: Normal rate, regular rhythm, normal heart sounds and intact distal pulses.  Exam reveals no gallop and no friction rub.   No murmur heard. Pulmonary/Chest: Effort normal and breath sounds normal. No stridor. No respiratory distress. He has no wheezes. He has no rales. He exhibits no tenderness.  Abdominal: Soft. Bowel sounds are normal. He exhibits no distension and no mass. There is no tenderness. There is no rebound and no guarding.  Genitourinary: Rectum normal, prostate normal and penis normal. Guaiac  negative stool. No penile tenderness.  Musculoskeletal: Normal range of motion. He exhibits no edema and no tenderness.  Lymphadenopathy:    He has no cervical adenopathy.  Neurological: He is alert and oriented to person, place, and time. He has normal reflexes. No cranial nerve deficit. He exhibits normal muscle tone.       Tremor consistent with Parkinson's disease  Skin: Skin is warm and dry. No rash noted. No erythema. No pallor.  Psychiatric: He has a normal mood and affect. His behavior is normal. Judgment and thought content normal.          Assessment & Plan:  Healthy male  Parkinson's disease continue current therapy and followup by neurology  Erectile dysfunction declines refill of Viagra

## 2012-08-03 NOTE — Patient Instructions (Signed)
Continue your current medications  Continue your exercise program  Followup in 1 year sooner if any problems  Strongly consider the shingles vaccine

## 2012-08-07 ENCOUNTER — Ambulatory Visit (INDEPENDENT_AMBULATORY_CARE_PROVIDER_SITE_OTHER): Payer: BC Managed Care – PPO | Admitting: Family Medicine

## 2012-08-07 DIAGNOSIS — Z23 Encounter for immunization: Secondary | ICD-10-CM

## 2012-08-07 DIAGNOSIS — Z2911 Encounter for prophylactic immunotherapy for respiratory syncytial virus (RSV): Secondary | ICD-10-CM

## 2012-08-14 ENCOUNTER — Other Ambulatory Visit: Payer: BC Managed Care – PPO

## 2012-08-21 ENCOUNTER — Encounter: Payer: BC Managed Care – PPO | Admitting: Family Medicine

## 2013-01-09 ENCOUNTER — Encounter: Payer: Self-pay | Admitting: Neurology

## 2013-01-09 ENCOUNTER — Ambulatory Visit (INDEPENDENT_AMBULATORY_CARE_PROVIDER_SITE_OTHER): Payer: Medicare Other | Admitting: Neurology

## 2013-01-09 VITALS — BP 162/92 | HR 68 | Ht 70.0 in | Wt 204.0 lb

## 2013-01-09 DIAGNOSIS — IMO0002 Reserved for concepts with insufficient information to code with codable children: Secondary | ICD-10-CM

## 2013-01-09 DIAGNOSIS — M1710 Unilateral primary osteoarthritis, unspecified knee: Secondary | ICD-10-CM

## 2013-01-09 DIAGNOSIS — G20 Parkinson's disease: Secondary | ICD-10-CM

## 2013-01-09 DIAGNOSIS — M5030 Other cervical disc degeneration, unspecified cervical region: Secondary | ICD-10-CM | POA: Insufficient documentation

## 2013-01-09 HISTORY — DX: Other cervical disc degeneration, unspecified cervical region: M50.30

## 2013-01-09 MED ORDER — SELEGILINE HCL 5 MG PO TABS: 5.0000 mg | ORAL_TABLET | Freq: Two times a day (BID) | ORAL | Status: DC

## 2013-01-09 MED ORDER — AMANTADINE HCL 100 MG PO CAPS
100.0000 mg | ORAL_CAPSULE | Freq: Two times a day (BID) | ORAL | Status: DC
Start: 2013-01-09 — End: 2013-09-18

## 2013-01-09 MED ORDER — ROPINIROLE HCL ER 8 MG PO TB24: 8.0000 mg | ORAL_TABLET | Freq: Every day | ORAL | Status: DC
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
## 2013-01-09 NOTE — Patient Instructions (Addendum)
I think overall you are doing fairly well and are stable at this point.   I do have some generic suggestions for you today:  Please make sure that you drink plenty of fluids. I would like for you to exercise daily for example in the form of walking 20-30 minutes every day, if you can. Please keep a regular sleep-wake schedule, keep regular meal times, do not skip any meals, eat  healthy snacks in between meals, such as fruit or nuts. Try to eat protein with every meal.   Engage in social activities in your community and with your family and try to keep up with current events by reading the newspaper or watching the news.  I do not think we need to make any changes in your medications at this point. I think you're stable enough that I can see you back in 6 months, sooner if we need to. Please call us if you have any interim questions, concerns, or problems or updates to need to discuss.  Our phone number is [phone]. We also have an after hours call service for urgent matters and there is a physician on-call for urgent questions. For any emergencies you know to call 911 or go to the nearest emergency room.

## 2013-07-11 ENCOUNTER — Ambulatory Visit: Payer: Medicare Other | Admitting: Neurology

## 2013-07-11 ENCOUNTER — Telehealth: Payer: Self-pay | Admitting: Neurology

## 2013-09-18 ENCOUNTER — Encounter (INDEPENDENT_AMBULATORY_CARE_PROVIDER_SITE_OTHER): Payer: Self-pay

## 2013-09-18 ENCOUNTER — Ambulatory Visit (INDEPENDENT_AMBULATORY_CARE_PROVIDER_SITE_OTHER): Payer: Medicare Other | Admitting: Neurology

## 2013-09-18 ENCOUNTER — Encounter: Payer: Self-pay | Admitting: Neurology

## 2013-09-18 VITALS — BP 138/83 | HR 62 | Temp 97.5°F | Ht 70.0 in | Wt 200.0 lb

## 2013-09-18 DIAGNOSIS — M1710 Unilateral primary osteoarthritis, unspecified knee: Secondary | ICD-10-CM

## 2013-09-18 DIAGNOSIS — G20 Parkinson's disease: Secondary | ICD-10-CM

## 2013-09-18 DIAGNOSIS — IMO0002 Reserved for concepts with insufficient information to code with codable children: Secondary | ICD-10-CM

## 2013-09-18 DIAGNOSIS — M5030 Other cervical disc degeneration, unspecified cervical region: Secondary | ICD-10-CM

## 2013-09-18 MED ORDER — ROPINIROLE HCL ER 8 MG PO TB24: 8.0000 mg | ORAL_TABLET | Freq: Every day | ORAL | Status: DC

## 2013-09-18 MED ORDER — AMANTADINE HCL 100 MG PO CAPS: 100.0000 mg | ORAL_CAPSULE | Freq: Two times a day (BID) | ORAL | Status: DC

## 2013-09-18 MED ORDER — SELEGILINE HCL 5 MG PO TABS: 5.0000 mg | ORAL_TABLET | Freq: Two times a day (BID) | ORAL | Status: DC
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
## 2013-09-18 NOTE — Patient Instructions (Signed)
I think your Parkinson's disease has remained fairly stable, which is reassuring. Nevertheless, as you know, this disease does progress with time. It can affect your balance, your memory, your mood, your bowel and bladder function, your posture, balance and walking. Overall you are doing fairly well but I do want to suggest a few things today:  Remember to drink plenty of fluid, eat healthy meals and do not skip any meals. Try to eat protein with a every meal and eat a healthy snack such as fruit or nuts in between meals. Try to keep a regular sleep-wake schedule and try to exercise daily, particularly in the form of walking, 20-30 minutes a day, if you can.   Taking your medication on schedule is key.   Try to stay active physically and mentally. Engage in social activities in your community and with your family and try to keep up with current events by reading the newspaper or watching the news. Try to do word puzzles and you may like to do word puzzles and brain games on the computer such as on [email or web address].com/.   As far as your medications are concerned, I would like to suggest that you take your current medication with the following additional changes: we will for now keep you on selegiline, but will enroll you with      As far as diagnostic testing, I will order: no new test today.  I would like to see you back in 6 months, sooner if we need to. Please call us with any interim questions, concerns, problems, updates or refill requests.  Brett Canales is my clinical assistant and will answer any of your questions and relay your messages to me and also relay most of my messages to you.  Our phone number is [phone]. We also have an after hours call service for urgent matters and there is a physician on-call for urgent questions, that cannot wait till the next work day. For any emergencies you know to call 911 or go to the nearest emergency room.

## 2013-10-19 ENCOUNTER — Telehealth: Payer: Self-pay | Admitting: *Deleted

## 2013-10-19 DIAGNOSIS — G20 Parkinson's disease: Secondary | ICD-10-CM

## 2013-10-19 MED ORDER — ROPINIROLE HCL ER 8 MG PO TB24: 8.0000 mg | ORAL_TABLET | Freq: Every day | ORAL | Status: DC

## 2013-10-19 NOTE — Telephone Encounter (Signed)
Dr Frances FurbishAthar already sent this Rx last month when patient was here for an OV.  I have resent the Rx.

## 2013-10-29 ENCOUNTER — Telehealth: Payer: Self-pay | Admitting: Family Medicine

## 2013-10-29 NOTE — Telephone Encounter (Signed)
Pt request refill sildenafil (VIAGRA) 25 MG tablet (used to be 50 mg)  #6 w/ refills Would prefer to self pay for this.   Would like a paper rx.

## 2013-10-30 ENCOUNTER — Telehealth: Payer: Self-pay | Admitting: Neurology

## 2013-10-30 NOTE — Telephone Encounter (Signed)
PT called in about his Ropinirole prescription.  He stated that he is on medicare and that they are charging him $200 for a 90 day supply.  He was told that the name brand Requip XL is the same price.  He asked if he could have the name brand since its the same price.  Please call.

## 2013-10-30 NOTE — Telephone Encounter (Signed)
We just got a prior auth approval for generic Ropinirole ER, if we change the patient to Brand Name, another PA is required.  I called the ins.  Spoke with Rachael.  She transferred me to another agent who took my contact info and said they will fax a form for [place] to complete.  I called the patient back.  Explained the situation.  He said he wants [place] to proceed with trying to get a prior auth for Brand name because he feels that makes more sense.  I asked him if his co-pay was the same because he has not yet met his deductible.  He was not certain.  Told him we will try to get a PA for brand, but it is ultimately up to the ins if they will approve this or not.  Patient verbalized understanding.

## 2013-10-30 NOTE — Telephone Encounter (Signed)
Spoke with patient and informed him that he will need an office visit.  Patient states he was here last year and will bring paperwork stating so.

## 2013-11-05 ENCOUNTER — Telehealth: Payer: Self-pay | Admitting: *Deleted

## 2013-11-05 NOTE — Telephone Encounter (Signed)
Jasmine DecemberSharon with [facility] called and relayed that the PA approved for one year for Requip.  11-02-13 thru 11-02-14.

## 2014-03-19 ENCOUNTER — Encounter (INDEPENDENT_AMBULATORY_CARE_PROVIDER_SITE_OTHER): Payer: Self-pay

## 2014-03-19 ENCOUNTER — Encounter: Payer: Self-pay | Admitting: Neurology

## 2014-03-19 ENCOUNTER — Ambulatory Visit (INDEPENDENT_AMBULATORY_CARE_PROVIDER_SITE_OTHER): Payer: Medicare Other | Admitting: Neurology

## 2014-03-19 VITALS — BP 141/81 | HR 61 | Ht 70.0 in | Wt 201.0 lb

## 2014-03-19 DIAGNOSIS — R600 Localized edema: Secondary | ICD-10-CM

## 2014-03-19 DIAGNOSIS — R609 Edema, unspecified: Secondary | ICD-10-CM

## 2014-03-19 DIAGNOSIS — G20 Parkinson's disease: Secondary | ICD-10-CM

## 2014-03-19 MED ORDER — SELEGILINE HCL 5 MG PO TABS: 5.0000 mg | ORAL_TABLET | Freq: Two times a day (BID) | ORAL | Status: DC

## 2014-03-19 MED ORDER — AMANTADINE HCL 100 MG PO CAPS: 100.0000 mg | ORAL_CAPSULE | Freq: Two times a day (BID) | ORAL | Status: DC

## 2014-03-19 MED ORDER — ROPINIROLE HCL ER 8 MG PO TB24: 8.0000 mg | ORAL_TABLET | Freq: Every day | ORAL | Status: DC

## 2014-03-19 NOTE — Patient Instructions (Signed)
Increase your exercise, watch the ankle swelling. Increase your water intake and ask Dr. Tawanna Coolerodd about the Viagra.  Keep up with current events.
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
## 2014-05-27 ENCOUNTER — Encounter: Payer: Self-pay | Admitting: Internal Medicine

## 2014-06-04 ENCOUNTER — Ambulatory Visit (INDEPENDENT_AMBULATORY_CARE_PROVIDER_SITE_OTHER): Payer: Medicare Other | Admitting: Family Medicine

## 2014-06-04 ENCOUNTER — Encounter: Payer: Self-pay | Admitting: Family Medicine

## 2014-06-04 VITALS — BP 120/80 | Temp 97.5°F | Ht 70.0 in | Wt 198.0 lb

## 2014-06-04 DIAGNOSIS — Z23 Encounter for immunization: Secondary | ICD-10-CM

## 2014-06-04 DIAGNOSIS — E785 Hyperlipidemia, unspecified: Secondary | ICD-10-CM

## 2014-06-04 DIAGNOSIS — G20 Parkinson's disease: Secondary | ICD-10-CM

## 2014-06-04 DIAGNOSIS — N401 Enlarged prostate with lower urinary tract symptoms: Secondary | ICD-10-CM | POA: Insufficient documentation

## 2014-06-04 DIAGNOSIS — R351 Nocturia: Secondary | ICD-10-CM

## 2014-06-04 DIAGNOSIS — N138 Other obstructive and reflux uropathy: Secondary | ICD-10-CM

## 2014-06-04 LAB — CBC WITH DIFFERENTIAL/PLATELET
Basophils Absolute: 0 10*3/uL (ref 0.0–0.1)
Basophils Relative: 0.5 % (ref 0.0–3.0)
Eosinophils Absolute: 0.1 10*3/uL (ref 0.0–0.7)
Eosinophils Relative: 1.2 % (ref 0.0–5.0)
HCT: 47 % (ref 39.0–52.0)
Hemoglobin: 15.6 g/dL (ref 13.0–17.0)
Lymphocytes Relative: 19.8 % (ref 12.0–46.0)
Lymphs Abs: 1 10*3/uL (ref 0.7–4.0)
MCHC: 33.3 g/dL (ref 30.0–36.0)
MCV: 92.9 fl (ref 78.0–100.0)
Monocytes Absolute: 0.4 10*3/uL (ref 0.1–1.0)
Monocytes Relative: 8.2 % (ref 3.0–12.0)
Neutro Abs: 3.5 10*3/uL (ref 1.4–7.7)
Neutrophils Relative %: 70.3 % (ref 43.0–77.0)
Platelets: 149 10*3/uL — ABNORMAL LOW (ref 150.0–400.0)
RBC: 5.05 Mil/uL (ref 4.22–5.81)
RDW: 13.8 % (ref 11.5–15.5)
WBC: 5 10*3/uL (ref 4.0–10.5)

## 2014-06-04 LAB — BASIC METABOLIC PANEL
BUN: 14 mg/dL (ref 6–23)
CO2: 31 mEq/L (ref 19–32)
Calcium: 8.8 mg/dL (ref 8.4–10.5)
Chloride: 104 mEq/L (ref 96–112)
Creatinine, Ser: 1.2 mg/dL (ref 0.4–1.5)
GFR: 63.61 mL/min (ref >60.00)
Glucose, Bld: 98 mg/dL (ref 70–99)
Potassium: 4.4 mEq/L (ref 3.5–5.1)
Sodium: 139 mEq/L (ref 135–145)

## 2014-06-04 LAB — LIPID PANEL
Cholesterol: 195 mg/dL (ref 0–200)
HDL: 60.8 mg/dL (ref >39.00)
LDL Cholesterol: 124 mg/dL — ABNORMAL HIGH (ref 0–99)
NonHDL: 134.2
Total CHOL/HDL Ratio: 3
Triglycerides: 51 mg/dL (ref 0.0–149.0)
VLDL: 10.2 mg/dL (ref 0.0–40.0)

## 2014-06-04 LAB — POCT URINALYSIS DIPSTICK
Bilirubin, UA: NEGATIVE
Blood, UA: NEGATIVE
Glucose, UA: NEGATIVE
Ketones, UA: NEGATIVE
Leukocytes, UA: NEGATIVE
Nitrite, UA: NEGATIVE
Protein, UA: NEGATIVE
Spec Grav, UA: 1.025
Urobilinogen, UA: 0.2
pH, UA: 5.5

## 2014-06-04 LAB — PSA: PSA: 0.47 ng/mL (ref 0.10–4.00)

## 2014-06-04 LAB — HEPATIC FUNCTION PANEL
ALT: 16 U/L (ref 0–53)
AST: 16 U/L (ref 0–37)
Albumin: 3.9 g/dL (ref 3.5–5.2)
Alkaline Phosphatase: 90 U/L (ref 39–117)
Bilirubin, Direct: 0.2 mg/dL (ref 0.0–0.3)
Total Bilirubin: 1 mg/dL (ref 0.2–1.2)
Total Protein: 6.2 g/dL (ref 6.0–8.3)

## 2014-06-04 LAB — TSH: TSH: 1.53 u[IU]/mL (ref 0.35–4.50)

## 2014-06-04 MED ORDER — SILDENAFIL CITRATE 20 MG PO TABS: 20.0000 mg | ORAL_TABLET | Freq: Three times a day (TID) | ORAL | Status: DC

## 2014-06-04 NOTE — Progress Notes (Signed)
   Subjective:    Patient ID: Francisco Gutierrez, male    DOB: March 14, 1946, 68 y.o.   MRN: [id number]  HPI Francisco Gutierrez is a 68 year old married male nonsmoker who comes in today for evaluation of Parkinson's disease and hyperlipidemia    He is on a 3 drug regimen by neurology.  He takes Viagra 50 mg when necessary for ED. He controls his hyperlipidemia with diet exercise and keeping his weight down.  He gets routine eye care, dental care, colonoscopy and GI, vaccinations updated by Fleet Contras  Cognitive function normal he walks daily home health safety reviewed no issues identified, no guns in the house, he does have a health care power of attorney and living well   Review of Systems  Constitutional: Negative.   HENT: Negative.   Eyes: Negative.   Respiratory: Negative.   Cardiovascular: Negative.   Gastrointestinal: Negative.   Genitourinary: Negative.   Musculoskeletal: Negative.   Skin: Negative.   Neurological: Negative.   Psychiatric/Behavioral: Negative.        Objective:   Physical Exam  Nursing note and vitals reviewed. Constitutional: He is oriented to person, place, and time. He appears well-developed and well-nourished.  HENT:  Head: Normocephalic and atraumatic.  Right Ear: External ear normal.  Left Ear: External ear normal.  Nose: Nose normal.  Mouth/Throat: Oropharynx is clear and moist.  Eyes: Conjunctivae and EOM are normal. Pupils are equal, round, and reactive to light.  Neck: Normal range of motion. Neck supple. No JVD present. No tracheal deviation present. No thyromegaly present.  Cardiovascular: Normal rate, regular rhythm, normal heart sounds and intact distal pulses.  Exam reveals no gallop and no friction rub.   No murmur heard. Pulmonary/Chest: Effort normal and breath sounds normal. No stridor. No respiratory distress. He has no wheezes. He has no rales. He exhibits no tenderness.  Abdominal: Soft. Bowel sounds are normal. He exhibits no distension and no mass.  There is no tenderness. There is no rebound and no guarding.  Genitourinary: Rectum normal and penis normal. Guaiac negative stool. No penile tenderness.  1+ symmetrical BPH  Musculoskeletal: Normal range of motion. He exhibits no edema and no tenderness.  Lymphadenopathy:    He has no cervical adenopathy.  Neurological: He is alert and oriented to person, place, and time. He has normal reflexes. He displays normal reflexes. No cranial nerve deficit. He exhibits normal muscle tone. Coordination abnormal.  Slight rigidity  Skin: Skin is warm and dry. No rash noted. No erythema. No pallor.  Psychiatric: He has a normal mood and affect. His behavior is normal. Judgment and thought content normal.          Assessment & Plan:  Parkinson's disease.......... continue current medications.....Marland Kitchen follow neurology.......Marland Kitchen prescribed daily exercise  Hyperlipidemia.......... check labs continue home therapy  Erectile dysfunction...........Marland Kitchen generic Viagra.Marland KitchenMarland Kitchen

## 2014-06-04 NOTE — Progress Notes (Signed)
Pre visit review using our clinic review tool, if applicable. No additional management support is needed unless otherwise documented below in the visit note. 

## 2014-06-04 NOTE — Patient Instructions (Signed)
Continue your current medications  Followup in 1 year sooner if any problems  Begin a daily exercise program,,,,,,,,,,, consider joining the [facility]

## 2014-09-05 ENCOUNTER — Telehealth: Payer: Self-pay | Admitting: Neurology

## 2014-09-05 DIAGNOSIS — G20 Parkinson's disease: Secondary | ICD-10-CM

## 2014-09-05 MED ORDER — SELEGILINE HCL 5 MG PO TABS: 5.0000 mg | ORAL_TABLET | Freq: Two times a day (BID) | ORAL | Status: DC

## 2014-09-05 NOTE — Telephone Encounter (Signed)
Rx has been sent.  I called back.  Patient is aware.  

## 2014-09-05 NOTE — Telephone Encounter (Signed)
Patient requesting 10 day supply of Rx selegiline (ELDEPRYL) 5 MG [facility] [place].  Please call and advise.

## 2014-09-19 ENCOUNTER — Encounter: Payer: Self-pay | Admitting: Neurology

## 2014-09-19 ENCOUNTER — Ambulatory Visit (INDEPENDENT_AMBULATORY_CARE_PROVIDER_SITE_OTHER): Payer: Medicare Other | Admitting: Neurology

## 2014-09-19 VITALS — BP 146/76 | HR 59 | Temp 97.5°F | Ht 70.0 in | Wt 205.0 lb

## 2014-09-19 DIAGNOSIS — G20 Parkinson's disease: Secondary | ICD-10-CM

## 2014-09-19 DIAGNOSIS — R600 Localized edema: Secondary | ICD-10-CM

## 2014-09-19 MED ORDER — AMANTADINE HCL 100 MG PO CAPS: 100.0000 mg | ORAL_CAPSULE | Freq: Two times a day (BID) | ORAL | Status: DC

## 2014-09-19 MED ORDER — ROPINIROLE HCL ER 8 MG PO TB24: 8.0000 mg | ORAL_TABLET | Freq: Every day | ORAL | Status: DC

## 2014-09-19 MED ORDER — SELEGILINE HCL 5 MG PO TABS
5.0000 mg | ORAL_TABLET | Freq: Two times a day (BID) | ORAL | Status: DC
Start: 2014-09-19 — End: 2015-01-20
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
## 2014-09-19 NOTE — Patient Instructions (Addendum)
Please try to keep your feet up when you sit.  Use over the counter compression stockings to your legs.  Stay active physically and mentally.  Your swelling is worse.  For now, we will keep you medications the same and monitor your leg swelling closely.

## 2014-11-14 ENCOUNTER — Telehealth: Payer: Self-pay | Admitting: Neurology

## 2014-11-14 DIAGNOSIS — G20 Parkinson's disease: Secondary | ICD-10-CM

## 2014-11-14 MED ORDER — ROPINIROLE HCL ER 8 MG PO TB24: 8.0000 mg | ORAL_TABLET | Freq: Every day | ORAL | Status: DC

## 2014-11-14 NOTE — Telephone Encounter (Signed)
Rx was already sent to pharm for 1 year at last OV in Dec.  I have sent this Rx again.  Called back, he is aware.

## 2014-11-14 NOTE — Telephone Encounter (Signed)
Patient requesting Rx refill for rOPINIRole (REQUIP XL) 8 MG 24 hr tablet [facility] to [facility].  Please call and advise.

## 2014-11-26 ENCOUNTER — Telehealth: Payer: Self-pay | Admitting: *Deleted

## 2014-11-26 NOTE — Telephone Encounter (Signed)
I contacted ins and provided clinical information asking that they grant an exception and cover this drug.  The request is currently under review.  They will notify patient of the outcome once a decision has been made.  I called the patient to advise.  Got no answer, left message.  (They do not require a letter unless the request for coverage is denied and has to be appealed.)

## 2014-11-26 NOTE — Telephone Encounter (Signed)
Ins will no longer supply the medication the patient is taking. Patient needs an exception written up so he can continue to receive Ropinirole tabs 8 mg. [facility] Please advise

## 2014-11-26 NOTE — Telephone Encounter (Signed)
Francisco Gutierrez: can you furnish a letter?

## 2014-11-28 NOTE — Telephone Encounter (Signed)
Grace BlightYaya with [employer] is calling to advise that rOPINIRole (REQUIP XL) 8 MG 24 hr tablet has been approved as of 11-26-14.

## 2015-01-20 ENCOUNTER — Encounter: Payer: Self-pay | Admitting: Neurology

## 2015-01-20 ENCOUNTER — Ambulatory Visit (INDEPENDENT_AMBULATORY_CARE_PROVIDER_SITE_OTHER): Payer: Medicare Other | Admitting: Neurology

## 2015-01-20 VITALS — BP 132/80 | HR 72 | Resp 18 | Ht 70.0 in | Wt 198.0 lb

## 2015-01-20 DIAGNOSIS — R600 Localized edema: Secondary | ICD-10-CM

## 2015-01-20 DIAGNOSIS — M5030 Other cervical disc degeneration, unspecified cervical region: Secondary | ICD-10-CM

## 2015-01-20 DIAGNOSIS — G20 Parkinson's disease: Secondary | ICD-10-CM | POA: Diagnosis not present

## 2015-01-20 DIAGNOSIS — G20A1 Parkinson's disease without dyskinesia, without mention of fluctuations: Secondary | ICD-10-CM

## 2015-01-20 MED ORDER — AMANTADINE HCL 100 MG PO CAPS: 100.0000 mg | ORAL_CAPSULE | Freq: Two times a day (BID) | ORAL | Status: DC

## 2015-01-20 MED ORDER — SELEGILINE HCL 5 MG PO TABS: 5.0000 mg | ORAL_TABLET | Freq: Two times a day (BID) | ORAL | Status: DC

## 2015-01-20 MED ORDER — ROPINIROLE HCL ER 8 MG PO TB24: 8.0000 mg | ORAL_TABLET | Freq: Every day | ORAL | Status: DC

## 2015-01-20 NOTE — Patient Instructions (Addendum)
I think your Parkinson's disease has progressed mildly. As you know, this disease does progress with time. It can affect your balance, your memory, your mood, your bowel and bladder function, your posture, balance and walking. Overall you are doing fairly well but I do want to suggest a few things today:   Remember to drink plenty of fluid, eat healthy meals and do not skip any meals. Try to eat protein with a every meal and eat a healthy snack such as fruit or nuts in between meals. Try to keep a regular sleep-wake schedule and try to exercise daily, particularly in the form of walking, 20-30 minutes a day, if you can.   Taking your medication on schedule is key.   Try to stay active physically and mentally. Engage in social activities in your community and with your family and try to keep up with current events by reading the newspaper or watching the news. Try to do word puzzles and you may like to do word puzzles and brain games on the computer such as on [email or web address].   As far as your medications are concerned, I would like to suggest that you take your current medication with the following additional changes: We will change the timing of your Requip XL 8 mg to 8 to 9 PM daily. Continue with Selegiline and amantadine at the current doses and times. I would like for you to consider starting Sinemet (generic name: carbidopa-levodopa) 25/100 mg: Take half a pill twice daily (8 AM and noon) for one week, then half a pill 3 times a day (8 AM, noon, and 4 PM) for one week, then we would go to one pill 3 times a day thereafter. Common side effects reported are: Nausea, vomiting, sedation, confusion, lightheadedness. Rare side effects include hallucinations, severe nausea or vomiting, diarrhea and significant drop in blood pressure especially when going from lying to standing or from sitting to standing. Call or email me if you would like to start this medication, I would encourage you to try it.   I would  like to see you back in 6 months, sooner if we need to. Please call us with any interim questions, concerns, problems, updates or refill requests.   Our phone number is [phone]. We also have an after hours call service for urgent matters and there is a physician on-call for urgent questions, that cannot wait till the next work day. For any emergencies you know to call 911 or go to the nearest emergency room.
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
## 2015-05-28 ENCOUNTER — Ambulatory Visit (INDEPENDENT_AMBULATORY_CARE_PROVIDER_SITE_OTHER): Payer: Medicare Other | Admitting: Neurology

## 2015-05-28 ENCOUNTER — Encounter: Payer: Self-pay | Admitting: Neurology

## 2015-05-28 VITALS — BP 138/78 | HR 56 | Resp 16 | Ht 70.0 in | Wt 200.0 lb

## 2015-05-28 DIAGNOSIS — G20 Parkinson's disease: Secondary | ICD-10-CM

## 2015-05-28 DIAGNOSIS — R600 Localized edema: Secondary | ICD-10-CM

## 2015-05-28 MED ORDER — AMANTADINE HCL 100 MG PO CAPS: 100.0000 mg | ORAL_CAPSULE | Freq: Three times a day (TID) | ORAL | Status: AC

## 2015-05-28 MED ORDER — ROPINIROLE HCL ER 8 MG PO TB24: 8.0000 mg | ORAL_TABLET | Freq: Every day | ORAL | Status: AC

## 2015-05-28 MED ORDER — SELEGILINE HCL 5 MG PO TABS: 5.0000 mg | ORAL_TABLET | Freq: Two times a day (BID) | ORAL | Status: DC

## 2015-05-28 NOTE — Patient Instructions (Signed)
We will try to increase your amantadine to 3 times a day. Everything else will be the same.  Drink 6-8 glasses of water, exercise in some form daily, if you can.  Follow up in 4 month.
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
## 2015-07-10 ENCOUNTER — Telehealth: Payer: Self-pay | Admitting: Neurology

## 2015-07-10 NOTE — Telephone Encounter (Signed)
Pt is calling because he is going to have Right knee replacement surgery 08-16-15. He would like a medical clearance letter if possible . Please call pt and notify when ready to pick [phone]

## 2015-07-14 ENCOUNTER — Other Ambulatory Visit: Payer: Self-pay | Admitting: Orthopedic Surgery

## 2015-07-14 NOTE — Telephone Encounter (Signed)
Do you want me to draw up a letter for you? Last appt was 8/24.

## 2015-07-14 NOTE — Telephone Encounter (Signed)
I left message for patient stating that we have a letter for him clearing him for surgery from a neurologic stand point. I voiced that total clearance will need to come from PCP or cardiologist. I asked him to call back and let us know if he would like to pick letter up? Or we can fax or mail it for him?

## 2015-07-14 NOTE — Telephone Encounter (Signed)
No contraindication from the neurological standpoint for surgery, medical clearance, however, needs to come from PCP and/or cardiology. You can draw up letter, thx

## 2015-07-16 NOTE — Telephone Encounter (Signed)
Patient called to ask if letter could be faxed to Surgeon/ Sports Medicine and Joint Replacement, [place]. Lucey Fax# [phone].

## 2015-07-16 NOTE — Telephone Encounter (Signed)
Left message stating that I will put letter at front desk for him to pick up at his convenience.

## 2015-07-17 NOTE — Telephone Encounter (Signed)
I spoke to Francisco Gutierrez and advised him that we have faxed letter in for him

## 2015-08-08 ENCOUNTER — Other Ambulatory Visit (HOSPITAL_COMMUNITY): Payer: Self-pay

## 2015-08-18 ENCOUNTER — Inpatient Hospital Stay: Admit: 2015-08-18 | Payer: Self-pay | Admitting: Orthopedic Surgery

## 2015-08-18 SURGERY — ARTHROPLASTY, KNEE, TOTAL
Anesthesia: Spinal | Laterality: Right

## 2015-09-15 ENCOUNTER — Other Ambulatory Visit: Payer: Self-pay | Admitting: Orthopedic Surgery

## 2015-10-07 ENCOUNTER — Encounter: Payer: Self-pay | Admitting: Family Medicine

## 2015-10-09 ENCOUNTER — Inpatient Hospital Stay (HOSPITAL_COMMUNITY): Admission: RE | Admit: 2015-10-09 | Payer: Self-pay | Source: Ambulatory Visit

## 2015-10-14 ENCOUNTER — Ambulatory Visit (INDEPENDENT_AMBULATORY_CARE_PROVIDER_SITE_OTHER): Payer: Medicare Other | Admitting: Neurology

## 2015-10-14 ENCOUNTER — Encounter: Payer: Self-pay | Admitting: Neurology

## 2015-10-14 VITALS — BP 128/74 | HR 68 | Resp 16 | Ht 70.0 in | Wt 203.0 lb

## 2015-10-14 DIAGNOSIS — R600 Localized edema: Secondary | ICD-10-CM

## 2015-10-14 DIAGNOSIS — M25561 Pain in right knee: Secondary | ICD-10-CM | POA: Diagnosis not present

## 2015-10-14 DIAGNOSIS — G4752 REM sleep behavior disorder: Secondary | ICD-10-CM

## 2015-10-14 DIAGNOSIS — G20 Parkinson's disease: Secondary | ICD-10-CM | POA: Diagnosis not present

## 2015-10-14 MED ORDER — CLONAZEPAM 0.5 MG PO TABS: ORAL_TABLET | ORAL | Status: DC

## 2015-10-14 NOTE — Patient Instructions (Signed)
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 This means, that you have a tendency to act out your dreams in your sleep.  We will try Klonopin (generic: clonazepam): 0.5 mg strength, take 1/2 pill each bedtime, then after 2 weeks, you can take 1 pill each night if needed. Common side effects include sedation or sleepiness, balance problem, personality changes.   We will keep your other medications the same for now.   We will see you back soon.
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
## 2015-10-20 ENCOUNTER — Inpatient Hospital Stay (HOSPITAL_COMMUNITY): Admission: RE | Admit: 2015-10-20 | Payer: Medicare Other | Source: Ambulatory Visit | Admitting: Orthopedic Surgery

## 2015-10-20 ENCOUNTER — Encounter (HOSPITAL_COMMUNITY): Admission: RE | Payer: Self-pay | Source: Ambulatory Visit

## 2015-10-20 SURGERY — ARTHROPLASTY, KNEE, TOTAL
Anesthesia: Spinal | Laterality: Right

## 2015-12-17 ENCOUNTER — Telehealth: Payer: Self-pay | Admitting: Neurology

## 2015-12-17 NOTE — Telephone Encounter (Signed)
PA submitted through CoverMyMeds.

## 2015-12-17 NOTE — Telephone Encounter (Signed)
Joni Reiningicole with [employer] is calling to get prior authorization for medication rOPINIRole (REQUIP XL) 8 MG 24 hr tablet for the patient.

## 2015-12-22 ENCOUNTER — Telehealth: Payer: Self-pay | Admitting: Neurology

## 2015-12-22 NOTE — Telephone Encounter (Signed)
Please call patient's wife back: Unfortunately, I have seen increased hallucinations in some of my Parkinson's patients when they were placed on narcotic pain medication.I think it is very important that he stay off of narcotic pain medications as much is possible. In addition, I recommended he stay very well hydrated, and if there are any medical issues such as urinary frequency, malodorous urine, cough or upper respiratory symptoms he needs to be checked out by primary care physician but otherwise, I do believe medication effect may be the cause for increased hallucinations, Parkinson's patients sometimes does take longer to recuperate from anesthesia, surgery, etc.

## 2015-12-22 NOTE — Telephone Encounter (Signed)
Pt's wife called sts he had knee replacement 12/11/15. Pt was taking hydrocodone-acetaminophen 7/5 mg 2 x day for pain. He stopped taking that on 12/19/15. He's having increased hallucinations since 12/17/15 that is getting worse daily. sts the night are worse than day. He gets up out of bed without his walker at night and is seeing people. He sees people in the daytime also just not as much. She said this had gotten better and he was sleeping thru the night when he started clonazePAM (KLONOPIN) 0.5 MG tablet but since surgery things have changed. She has contacted his orthopedic provider and he does not think it has to do with the pain medication. Home health RN came on Saturday 12/20/15 and noticed he was having hallucinations.

## 2015-12-23 ENCOUNTER — Telehealth: Payer: Self-pay | Admitting: Neurology

## 2015-12-23 NOTE — Telephone Encounter (Signed)
Received fax today that Ropiniroe HCL ER 8mg  has been approved by [employer] of  until 12/16/2016. PA [id number]. Member ID [id number].

## 2015-12-23 NOTE — Telephone Encounter (Signed)
This is just an FYI:  Patient last took medication (Hydrocodone) on Friday. He was advised by surgeon to go to hospital and was admitted. Patient is under [facility] of [facility] They feel that the hallucinations are due to anesthesia and he is taking longer to recover. Dr. Rosalia Hammersay was able to read your notes for Francisco Gutierrez and "had nothing but good things to say about your care".  Wife will call back if they need anything. They will make follow up appt as soon as patient is out of the hospital and better.

## 2015-12-23 NOTE — Telephone Encounter (Signed)
Dr. Rosalia Hammersay is calling and states she has your patient in the hospital @ [place].  She would appreciate a callback [phone].  Thanks!
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
## 2015-12-24 NOTE — Telephone Encounter (Signed)
I called and talked to Dr. Rosalia Hammersay who was kind enough to give me an update on Mr. Francisco Gutierrez. She took care of him while he was in the hospital recently. She is a hospitalist who happens to be also a specialist in perioperative medicine. Patient was found to be quite restless, CT head was negative for acute finding, he was agitated and acting out and history, not sleeping well. He was provided with a short-term prescription for Zyprexa 5 mg at night. He was discharged to home with wife and need for 24-7 supervision. [place] and are in the process of establishing care with a local neurologist. In the interim, I would be happy to see him back here if it is possible for them to come here for a follow-up. Sometimes, in the immediate or recent postoperative phase, patients with Parkinson's disease, especially with longer standing Parkinson's disease can have setbacks including motor exacerbation but also nonmotor symptoms such as increased and balance problems, hallucinations, mental status changes, dream enactments. We may need to scale back on his amantadine, maybe even the Requip, especially in light of his lower extremity swelling, and increase his clonazepam.  Lafonda Mossesiana: Please call to offer follow-up appointment with me anytime they can make it.

## 2016-01-13 ENCOUNTER — Ambulatory Visit: Payer: Medicare Other | Admitting: Neurology

## 2016-02-03 ENCOUNTER — Ambulatory Visit: Payer: Medicare Other | Admitting: Neurology

## 2016-04-13 ENCOUNTER — Other Ambulatory Visit: Payer: Self-pay | Admitting: Neurology

## 2016-05-11 ENCOUNTER — Ambulatory Visit: Payer: Medicare Other | Admitting: Neurology

## 2016-06-28 ENCOUNTER — Telehealth: Payer: Self-pay

## 2016-06-28 DIAGNOSIS — G20 Parkinson's disease: Secondary | ICD-10-CM

## 2016-06-28 MED ORDER — SELEGILINE HCL 5 MG PO TABS: 5.0000 mg | ORAL_TABLET | Freq: Two times a day (BID) | ORAL | 0 refills | Status: AC

## 2016-06-28 NOTE — Telephone Encounter (Signed)
Refill request

## 2016-09-28 ENCOUNTER — Other Ambulatory Visit: Payer: Self-pay | Admitting: Neurology

## 2016-09-28 DIAGNOSIS — G20 Parkinson's disease: Secondary | ICD-10-CM

## 2016-09-29 NOTE — Telephone Encounter (Signed)
I called pt to advise him that he needs an appt with Dr. Frances FurbishAthar before we can refill the selegiline. I left a detailed message on pt's cell phone/home phone explaining this and asking him to give me a call back.

## 2016-09-30 ENCOUNTER — Other Ambulatory Visit: Payer: Self-pay | Admitting: Neurology

## 2016-09-30 DIAGNOSIS — G20 Parkinson's disease: Secondary | ICD-10-CM

## 2017-10-01 ENCOUNTER — Other Ambulatory Visit: Payer: Self-pay | Admitting: Neurology

## 2017-10-01 DIAGNOSIS — G20 Parkinson's disease: Secondary | ICD-10-CM

## 2017-12-25 ENCOUNTER — Other Ambulatory Visit: Payer: Self-pay | Admitting: Neurology

## 2017-12-25 DIAGNOSIS — G20 Parkinson's disease: Secondary | ICD-10-CM

## 2018-02-20 ENCOUNTER — Encounter: Payer: Self-pay | Admitting: Internal Medicine

## 2024-10-04 DEATH — deceased
